# Patient Record
Sex: Female | Born: 1945 | Race: White | Hispanic: No | Marital: Married | Smoking: Former smoker
Health system: Southern US, Community
[De-identification: ages and names within clinical notes are randomized; demographics above are authoritative.]

## PROBLEM LIST (undated history)

## (undated) DIAGNOSIS — N189 Chronic kidney disease, unspecified: Secondary | ICD-10-CM

## (undated) DIAGNOSIS — M1990 Unspecified osteoarthritis, unspecified site: Secondary | ICD-10-CM

## (undated) DIAGNOSIS — K5900 Constipation, unspecified: Secondary | ICD-10-CM

## (undated) DIAGNOSIS — R233 Spontaneous ecchymoses: Secondary | ICD-10-CM

## (undated) DIAGNOSIS — I10 Essential (primary) hypertension: Secondary | ICD-10-CM

## (undated) DIAGNOSIS — M2550 Pain in unspecified joint: Secondary | ICD-10-CM

## (undated) DIAGNOSIS — G8929 Other chronic pain: Secondary | ICD-10-CM

## (undated) DIAGNOSIS — K219 Gastro-esophageal reflux disease without esophagitis: Secondary | ICD-10-CM

## (undated) DIAGNOSIS — M810 Age-related osteoporosis without current pathological fracture: Secondary | ICD-10-CM

## (undated) DIAGNOSIS — H269 Unspecified cataract: Secondary | ICD-10-CM

## (undated) DIAGNOSIS — M109 Gout, unspecified: Secondary | ICD-10-CM

## (undated) DIAGNOSIS — M62838 Other muscle spasm: Secondary | ICD-10-CM

## (undated) DIAGNOSIS — R351 Nocturia: Secondary | ICD-10-CM

## (undated) DIAGNOSIS — R350 Frequency of micturition: Secondary | ICD-10-CM

## (undated) DIAGNOSIS — R1310 Dysphagia, unspecified: Secondary | ICD-10-CM

## (undated) DIAGNOSIS — R42 Dizziness and giddiness: Secondary | ICD-10-CM

## (undated) DIAGNOSIS — R531 Weakness: Secondary | ICD-10-CM

## (undated) DIAGNOSIS — R238 Other skin changes: Secondary | ICD-10-CM

## (undated) DIAGNOSIS — M549 Dorsalgia, unspecified: Secondary | ICD-10-CM

## (undated) HISTORY — PX: EYE SURGERY: SHX253

## (undated) HISTORY — PX: TONSILLECTOMY: SUR1361

## (undated) HISTORY — PX: COLONOSCOPY: SHX174

## (undated) HISTORY — PX: DILATION AND CURETTAGE OF UTERUS: SHX78

## (undated) HISTORY — PX: CARPAL TUNNEL RELEASE: SHX101

## (undated) HISTORY — PX: COLONOSCOPY WITH ESOPHAGOGASTRODUODENOSCOPY (EGD) AND ESOPHAGEAL DILATION (ED): SHX6495

---

## 2005-11-14 HISTORY — PX: BACK SURGERY: SHX140

## 2005-12-31 ENCOUNTER — Encounter
Admission: RE | Admit: 2005-12-31 | Discharge: 2005-12-31 | Payer: Self-pay | Admitting: Physical Medicine and Rehabilitation

## 2006-08-08 ENCOUNTER — Encounter
Admission: RE | Admit: 2006-08-08 | Discharge: 2006-08-08 | Payer: Self-pay | Admitting: Physical Medicine and Rehabilitation

## 2006-08-18 ENCOUNTER — Ambulatory Visit (HOSPITAL_COMMUNITY): Admission: RE | Admit: 2006-08-18 | Discharge: 2006-08-19 | Payer: Self-pay | Admitting: Neurological Surgery

## 2008-03-30 ENCOUNTER — Encounter: Admission: RE | Admit: 2008-03-30 | Discharge: 2008-03-30 | Payer: Self-pay | Admitting: Neurological Surgery

## 2011-04-01 NOTE — Op Note (Signed)
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   PROCEDURES:  Decompressive laminectomy, bilateral medial facetectomy and  foraminotomy, L2-3, followed by bilateral microdiskectomy, L2-3, for nerve  root and control canal decompression using microscopic dissection.   SURGEON:  Tia Alert, MD.   ASSISTANT:  Dr. Altamease Oiler.   ANESTHESIA:  General endotracheal.   COMPLICATIONS:  None apparent.   INDICATIONS FOR PROCEDURE:  Tina Shaw is a 64 year old female who has a  long history of back pain.  However, she has undergone epidural steroid  injections, and after her last injection, noted some onset of acute pain  with difficulty with ambulation.  She had a followup CT myelogram, which  showed a complete myelographic block at L2-3, and she was sent for  neurosurgical evaluation.  I found her to have vague weakness in her legs  and complaining of severe leg pain and some numbness.  Her CT myelogram  showed a complete myelographic block at L2-3 from what looked like a large  disk herniation.  I recommended a decompressive laminectomy, followed by  microdiskectomy.  She understood the risks, benefits and expected outcomes  and wished to proceed.   DESCRIPTION OF PROCEDURE:  The patient was taken to the operating room, and  after induction of adequate general endotracheal anesthesia, she was rolled  into the prone  position on the Wilson frame and all pressure points were  padded.  Her lumbar region was prepped with DuraPrep and draped in the usual  sterile fashion.  Then, 5 cc of local anesthesia was injected, and a small  dorsal midline incision was made and carried through to the lumbosacral  fascia.  The fascia was opened and the paraspinous musculature was taken  down in a subperiosteal fashion to expose the L2-3 interspace bilaterally.  A self-retaining retractor was placed.  Intraoperative x-ray confirmed our  level, and then I removed the spinous process, and then used the Kerrison  punch and a high-speed drill to perform a laminectomy, medial facetectomy  and bilateral foraminotomy at L2-3.  As I removed the yellow ligament, I  identified the underlying dura and the L3 nerve roots bilaterally.  She had  a small pinhole in the dura, which is where her myelogram was performed.  This was noted.  We retracted the dura medially and found several large free  fragments, which were removed from the lateral recess on the left side, and  then brought in the operating microscope and incised the disk space  and  performed a thorough intradiskal diskectomy with pituitary rongeurs.  We  then exposed the other side and coagulated the epidural venous fascia, where  we found a large free fragment on that side as well.  Once our diskectomy  was complete, we palpated with a coronary dilator into the midline and into  the foramina.  We felt no more compression and the thecal sac was  decompressed.  We irrigated with saline solution.  We then lined the dura  with Gelfoam, and then used Tisseel fibrin glue because of the myelogram  hole at the distal end of the interspace.  We then dried all bleeding points  and closed the fascia with a #1 Vicryl for the subcutaneous and subcuticular  tissues, and 2-0 and 3-0 Vicryl, and closed the skin with Dermabond.  The  drapes were removed.  A sterile dressing was applied.  The  patient was  awakened from general anesthesia and transferred to the recovery room in  stable condition.  At the end of the procedure, all sponge, needle and  instrument counts were correct.      Tia Alert, MD  Electronically Signed     DSJ/MEDQ  D:  08/18/2006  T:  08/19/2006  Job:  [id number]

## 2011-09-26 ENCOUNTER — Other Ambulatory Visit: Payer: Self-pay | Admitting: Neurological Surgery

## 2011-09-26 DIAGNOSIS — M545 Low back pain, unspecified: Secondary | ICD-10-CM

## 2011-10-02 ENCOUNTER — Ambulatory Visit
Admission: RE | Admit: 2011-10-02 | Discharge: 2011-10-02 | Disposition: A | Discharge status: Home / Self Care | Payer: BC Managed Care – PPO | Source: Ambulatory Visit | Attending: Neurological Surgery | Admitting: Neurological Surgery

## 2011-10-02 DIAGNOSIS — M545 Low back pain: Secondary | ICD-10-CM

## 2015-04-15 HISTORY — PX: CHOLECYSTECTOMY: SHX55

## 2015-06-24 ENCOUNTER — Other Ambulatory Visit: Payer: Self-pay | Admitting: Neurological Surgery

## 2015-07-08 ENCOUNTER — Encounter (HOSPITAL_COMMUNITY)
Admission: RE | Admit: 2015-07-08 | Discharge: 2015-07-08 | Disposition: A | Discharge status: Home / Self Care | Payer: BLUE CROSS/BLUE SHIELD | Source: Ambulatory Visit | Attending: Neurological Surgery | Admitting: Neurological Surgery

## 2015-07-08 ENCOUNTER — Encounter (HOSPITAL_COMMUNITY): Payer: Self-pay

## 2015-07-08 DIAGNOSIS — Z01818 Encounter for other preprocedural examination: Secondary | ICD-10-CM | POA: Diagnosis present

## 2015-07-08 DIAGNOSIS — I10 Essential (primary) hypertension: Secondary | ICD-10-CM | POA: Diagnosis not present

## 2015-07-08 DIAGNOSIS — Z01812 Encounter for preprocedural laboratory examination: Secondary | ICD-10-CM | POA: Insufficient documentation

## 2015-07-08 DIAGNOSIS — K219 Gastro-esophageal reflux disease without esophagitis: Secondary | ICD-10-CM | POA: Diagnosis not present

## 2015-07-08 DIAGNOSIS — M4806 Spinal stenosis, lumbar region: Secondary | ICD-10-CM | POA: Diagnosis not present

## 2015-07-08 DIAGNOSIS — Z87891 Personal history of nicotine dependence: Secondary | ICD-10-CM | POA: Diagnosis not present

## 2015-07-08 DIAGNOSIS — M48061 Spinal stenosis, lumbar region without neurogenic claudication: Secondary | ICD-10-CM

## 2015-07-08 HISTORY — DX: Dizziness and giddiness: R42

## 2015-07-08 HISTORY — DX: Age-related osteoporosis without current pathological fracture: M81.0

## 2015-07-08 HISTORY — DX: Other muscle spasm: M62.838

## 2015-07-08 HISTORY — DX: Chronic kidney disease, unspecified: N18.9

## 2015-07-08 HISTORY — DX: Essential (primary) hypertension: I10

## 2015-07-08 HISTORY — DX: Other chronic pain: G89.29

## 2015-07-08 HISTORY — DX: Unspecified cataract: H26.9

## 2015-07-08 HISTORY — DX: Nocturia: R35.1

## 2015-07-08 HISTORY — DX: Frequency of micturition: R35.0

## 2015-07-08 HISTORY — DX: Constipation, unspecified: K59.00

## 2015-07-08 HISTORY — DX: Gout, unspecified: M10.9

## 2015-07-08 HISTORY — DX: Unspecified osteoarthritis, unspecified site: M19.90

## 2015-07-08 HISTORY — DX: Gastro-esophageal reflux disease without esophagitis: K21.9

## 2015-07-08 HISTORY — DX: Pain in unspecified joint: M25.50

## 2015-07-08 HISTORY — DX: Weakness: R53.1

## 2015-07-08 HISTORY — DX: Dysphagia, unspecified: R13.10

## 2015-07-08 HISTORY — DX: Spontaneous ecchymoses: R23.3

## 2015-07-08 HISTORY — DX: Other skin changes: R23.8

## 2015-07-08 HISTORY — DX: Dorsalgia, unspecified: M54.9

## 2015-07-08 LAB — CBC WITH DIFFERENTIAL/PLATELET
BASOS ABS: 0.1 10*3/uL (ref 0.0–0.1)
BASOS PCT: 0 % (ref 0–1)
EOS ABS: 0.1 10*3/uL (ref 0.0–0.7)
Eosinophils Relative: 1 % (ref 0–5)
HCT: 41.6 % (ref 36.0–46.0)
Hemoglobin: 14 g/dL (ref 12.0–15.0)
Lymphocytes Relative: 22 % (ref 12–46)
Lymphs Abs: 2.7 10*3/uL (ref 0.7–4.0)
MCH: 30.2 pg (ref 26.0–34.0)
MCHC: 33.7 g/dL (ref 30.0–36.0)
MCV: 89.8 fL (ref 78.0–100.0)
MONO ABS: 0.8 10*3/uL (ref 0.1–1.0)
MONOS PCT: 6 % (ref 3–12)
Neutro Abs: 8.8 10*3/uL — ABNORMAL HIGH (ref 1.7–7.7)
Neutrophils Relative %: 71 % (ref 43–77)
PLATELETS: 390 10*3/uL (ref 150–400)
RBC: 4.63 MIL/uL (ref 3.87–5.11)
RDW: 13.9 % (ref 11.5–15.5)
WBC: 12.4 10*3/uL — ABNORMAL HIGH (ref 4.0–10.5)

## 2015-07-08 LAB — BASIC METABOLIC PANEL
ANION GAP: 9 (ref 5–15)
BUN: 9 mg/dL (ref 6–20)
CALCIUM: 9.8 mg/dL (ref 8.9–10.3)
CO2: 27 mmol/L (ref 22–32)
CREATININE: 1.27 mg/dL — AB (ref 0.44–1.00)
Chloride: 104 mmol/L (ref 101–111)
GFR, EST AFRICAN AMERICAN: 50 mL/min — AB (ref >60)
GFR, EST NON AFRICAN AMERICAN: 43 mL/min — AB (ref >60)
GLUCOSE: 97 mg/dL (ref 65–99)
Potassium: 3.5 mmol/L (ref 3.5–5.1)
Sodium: 140 mmol/L (ref 135–145)

## 2015-07-08 LAB — SURGICAL PCR SCREEN
MRSA, PCR: NEGATIVE
STAPHYLOCOCCUS AUREUS: NEGATIVE

## 2015-07-08 LAB — PROTIME-INR
INR: 1.05 (ref 0.00–1.49)
PROTHROMBIN TIME: 13.9 s (ref 11.6–15.2)

## 2015-07-08 NOTE — Pre-Procedure Instructions (Signed)
Tina Shaw  07/08/2015      [facility] [address] [place] - [address] [place] [address] Phone: [phone] Fax: [phone]    Your procedure is scheduled on Fri, Sept 2 @ 11:00 AM  Report to [facility] Admitting at 9:00 AM  Call this number if you have problems the morning of surgery:  [phone]   Remember:  Do not eat food or drink liquids after midnight.  Take these medicines the morning of surgery with A SIP OF WATER Allopurinol(Zyloprim),Diltiazem(Cardizem),Pain Pill(if needed),Antivert(Meclizine),and Zantac(Ranitidine)              No Goody's,BC's,Aleve,Aspirin,Ibuprofen,Fish Oil,or any Herbal Medications.    Do not wear jewelry, make-up or nail polish.  Do not wear lotions, powders, or perfumes.  You may wear deodorant.  Do not shave 48 hours prior to surgery.    Do not bring valuables to the hospital.  [facility] is not responsible for any belongings or valuables.  Contacts, dentures or bridgework may not be worn into surgery.  Leave your suitcase in the car.  After surgery it may be brought to your room.  For patients admitted to the hospital, discharge time will be determined by your treatment team.  Patients discharged the day of surgery will not be allowed to drive home.    Special instructions:  [facility] - Preparing for Surgery  Before surgery, you can play an important role.  Because skin is not sterile, your skin needs to be as free of germs as possible.  You can reduce the number of germs on you skin by washing with CHG (chlorahexidine gluconate) soap before surgery.  CHG is an antiseptic cleaner which kills germs and bonds with the skin to continue killing germs even after washing.  Please DO NOT use if you have an allergy to CHG or antibacterial soaps.  If your skin becomes reddened/irritated stop using the CHG and inform your nurse when you arrive at Short Stay.  Do not shave (including  legs and underarms) for at least 48 hours prior to the first CHG shower.  You may shave your face.  Please follow these instructions carefully:   1.  Shower with CHG Soap the night before surgery and the                                morning of Surgery.  2.  If you choose to wash your hair, wash your hair first as usual with your       normal shampoo.  3.  After you shampoo, rinse your hair and body thoroughly to remove the                      Shampoo.  4.  Use CHG as you would any other liquid soap.  You can apply chg directly       to the skin and wash gently with scrungie or a clean washcloth.  5.  Apply the CHG Soap to your body ONLY FROM THE NECK DOWN.        Do not use on open wounds or open sores.  Avoid contact with your eyes,       ears, mouth and genitals (private parts).  Wash genitals (private parts)       with your normal soap.  6.  Wash thoroughly, paying special attention to the area  where your surgery        will be performed.  7.  Thoroughly rinse your body with warm water from the neck down.  8.  DO NOT shower/wash with your normal soap after using and rinsing off       the CHG Soap.  9.  Pat yourself dry with a clean towel.            10.  Wear clean pajamas.            11.  Place clean sheets on your bed the night of your first shower and do not        sleep with pets.  Day of Surgery  Do not apply any lotions/deoderants the morning of surgery.  Please wear clean clothes to the hospital/surgery center.    Please read over the following fact sheets that you were given. Pain Booklet, Coughing and Deep Breathing, MRSA Information and Surgical Site Infection Prevention

## 2015-07-08 NOTE — Progress Notes (Addendum)
Cardiologist denies having one  Medical Md is Dr.Betty Elige Radon in [place]  EKG to be requested from [facility] in [place]  CXR denies having one in past yr  Echo denies ever having one  Stress test >14yrs ago  Heart cath denies ever having one

## 2015-07-16 MED ORDER — CEFAZOLIN SODIUM-DEXTROSE 2-3 GM-% IV SOLR
2.0000 g | INTRAVENOUS | Status: AC
  Administered 2015-07-17: 2 g via INTRAVENOUS
  Filled 2015-07-16: qty 50

## 2015-07-16 MED ORDER — DEXAMETHASONE SODIUM PHOSPHATE 10 MG/ML IJ SOLN
10.0000 mg | INTRAMUSCULAR | Status: AC
  Administered 2015-07-17: 10 mg via INTRAVENOUS
  Filled 2015-07-16: qty 1

## 2015-07-16 NOTE — Anesthesia Preprocedure Evaluation (Addendum)
Anesthesia Evaluation  Patient identified by MRN, date of birth, ID band Patient awake    Reviewed: Allergy & Precautions, NPO status , Patient's Chart, lab work & pertinent test results  Airway Mallampati: II   Neck ROM: Full    Dental  (+) Dental Advisory Given, Teeth Intact   Pulmonary former smoker,  breath sounds clear to auscultation        Cardiovascular hypertension, Pt. on medications Rhythm:Regular     Neuro/Psych    GI/Hepatic GERD-  ,  Endo/Other    Renal/GU Renal InsufficiencyRenal diseaseCreat 1.27     Musculoskeletal   Abdominal (+) + obese,   Peds  Hematology 14/42   Anesthesia Other Findings   Reproductive/Obstetrics                            Anesthesia Physical Anesthesia Plan  ASA: III  Anesthesia Plan: General   Post-op Pain Management:    Induction: Intravenous  Airway Management Planned: Oral ETT and Video Laryngoscope Planned  Additional Equipment:   Intra-op Plan:   Post-operative Plan: Extubation in OR  Informed Consent: I have reviewed the patients History and Physical, chart, labs and discussed the procedure including the risks, benefits and alternatives for the proposed anesthesia with the patient or authorized representative who has indicated his/her understanding and acceptance.     Plan Discussed with:   Anesthesia Plan Comments: (Glide available, GET EKG)        Anesthesia Quick Evaluation

## 2015-07-17 ENCOUNTER — Encounter (HOSPITAL_COMMUNITY): Payer: Self-pay | Admitting: *Deleted

## 2015-07-17 ENCOUNTER — Inpatient Hospital Stay (HOSPITAL_COMMUNITY)
Admission: RE | Admit: 2015-07-17 | Discharge: 2015-07-17 | DRG: 520 | Disposition: A | Discharge status: Home / Self Care | Payer: BLUE CROSS/BLUE SHIELD | Source: Ambulatory Visit | Attending: Neurological Surgery | Admitting: Neurological Surgery

## 2015-07-17 ENCOUNTER — Inpatient Hospital Stay (HOSPITAL_COMMUNITY): Payer: BLUE CROSS/BLUE SHIELD | Admitting: Anesthesiology

## 2015-07-17 ENCOUNTER — Encounter (HOSPITAL_COMMUNITY)
Admission: AD | Disposition: A | Discharge status: Home / Self Care | Payer: Self-pay | Source: Ambulatory Visit | Attending: Neurological Surgery

## 2015-07-17 ENCOUNTER — Inpatient Hospital Stay (HOSPITAL_COMMUNITY): Payer: BLUE CROSS/BLUE SHIELD

## 2015-07-17 DIAGNOSIS — M419 Scoliosis, unspecified: Secondary | ICD-10-CM | POA: Diagnosis present

## 2015-07-17 DIAGNOSIS — M62838 Other muscle spasm: Secondary | ICD-10-CM | POA: Diagnosis present

## 2015-07-17 DIAGNOSIS — Z9049 Acquired absence of other specified parts of digestive tract: Secondary | ICD-10-CM | POA: Diagnosis present

## 2015-07-17 DIAGNOSIS — Z87891 Personal history of nicotine dependence: Secondary | ICD-10-CM

## 2015-07-17 DIAGNOSIS — M4806 Spinal stenosis, lumbar region: Secondary | ICD-10-CM | POA: Diagnosis present

## 2015-07-17 DIAGNOSIS — K5900 Constipation, unspecified: Secondary | ICD-10-CM | POA: Diagnosis present

## 2015-07-17 DIAGNOSIS — R351 Nocturia: Secondary | ICD-10-CM | POA: Diagnosis present

## 2015-07-17 DIAGNOSIS — R350 Frequency of micturition: Secondary | ICD-10-CM | POA: Diagnosis present

## 2015-07-17 DIAGNOSIS — M1990 Unspecified osteoarthritis, unspecified site: Secondary | ICD-10-CM | POA: Diagnosis present

## 2015-07-17 DIAGNOSIS — H269 Unspecified cataract: Secondary | ICD-10-CM | POA: Diagnosis present

## 2015-07-17 DIAGNOSIS — G8929 Other chronic pain: Secondary | ICD-10-CM | POA: Diagnosis present

## 2015-07-17 DIAGNOSIS — R1310 Dysphagia, unspecified: Secondary | ICD-10-CM | POA: Diagnosis present

## 2015-07-17 DIAGNOSIS — I129 Hypertensive chronic kidney disease with stage 1 through stage 4 chronic kidney disease, or unspecified chronic kidney disease: Secondary | ICD-10-CM | POA: Diagnosis present

## 2015-07-17 DIAGNOSIS — R42 Dizziness and giddiness: Secondary | ICD-10-CM | POA: Diagnosis present

## 2015-07-17 DIAGNOSIS — M4326 Fusion of spine, lumbar region: Secondary | ICD-10-CM

## 2015-07-17 DIAGNOSIS — N183 Chronic kidney disease, stage 3 (moderate): Secondary | ICD-10-CM | POA: Diagnosis present

## 2015-07-17 DIAGNOSIS — K219 Gastro-esophageal reflux disease without esophagitis: Secondary | ICD-10-CM | POA: Diagnosis present

## 2015-07-17 DIAGNOSIS — Z79891 Long term (current) use of opiate analgesic: Secondary | ICD-10-CM

## 2015-07-17 DIAGNOSIS — M5126 Other intervertebral disc displacement, lumbar region: Secondary | ICD-10-CM | POA: Diagnosis present

## 2015-07-17 DIAGNOSIS — M109 Gout, unspecified: Secondary | ICD-10-CM | POA: Diagnosis present

## 2015-07-17 DIAGNOSIS — Z9889 Other specified postprocedural states: Secondary | ICD-10-CM

## 2015-07-17 DIAGNOSIS — M810 Age-related osteoporosis without current pathological fracture: Secondary | ICD-10-CM | POA: Diagnosis present

## 2015-07-17 DIAGNOSIS — Z79899 Other long term (current) drug therapy: Secondary | ICD-10-CM | POA: Diagnosis not present

## 2015-07-17 HISTORY — PX: LUMBAR LAMINECTOMY/DECOMPRESSION MICRODISCECTOMY: SHX5026

## 2015-07-17 SURGERY — LUMBAR LAMINECTOMY/DECOMPRESSION MICRODISCECTOMY 2 LEVELS
Anesthesia: General | Site: Back | Laterality: Bilateral

## 2015-07-17 MED ORDER — GELATIN ABSORBABLE MT POWD
OROMUCOSAL | Status: DC | PRN
  Administered 2015-07-17: 10 mL via TOPICAL

## 2015-07-17 MED ORDER — MENTHOL 3 MG MT LOZG: 1.0000 | LOZENGE | OROMUCOSAL | Status: DC | PRN

## 2015-07-17 MED ORDER — PROPOFOL 10 MG/ML IV BOLUS
INTRAVENOUS | Status: DC | PRN
  Administered 2015-07-17: 30 mg via INTRAVENOUS
  Administered 2015-07-17: 170 mg via INTRAVENOUS

## 2015-07-17 MED ORDER — CEFAZOLIN SODIUM 1-5 GM-% IV SOLN
1.0000 g | Freq: Three times a day (TID) | INTRAVENOUS | Status: DC
  Filled 2015-07-17 (×2): qty 50

## 2015-07-17 MED ORDER — PROMETHAZINE HCL 25 MG/ML IJ SOLN: 6.2500 mg | INTRAMUSCULAR | Status: DC | PRN

## 2015-07-17 MED ORDER — CYCLOBENZAPRINE HCL 10 MG PO TABS: 10.0000 mg | ORAL_TABLET | Freq: Three times a day (TID) | ORAL | Status: DC | PRN

## 2015-07-17 MED ORDER — POTASSIUM CHLORIDE IN NACL 20-0.9 MEQ/L-% IV SOLN
INTRAVENOUS | Status: DC
  Filled 2015-07-17 (×2): qty 1000

## 2015-07-17 MED ORDER — ONDANSETRON HCL 4 MG/2ML IJ SOLN
INTRAMUSCULAR | Status: DC | PRN
Start: 2015-07-17 — End: 2015-07-17
  Administered 2015-07-17: 4 mg via INTRAVENOUS

## 2015-07-17 MED ORDER — MIDAZOLAM HCL 5 MG/5ML IJ SOLN
INTRAMUSCULAR | Status: DC | PRN
  Administered 2015-07-17: 2 mg via INTRAVENOUS

## 2015-07-17 MED ORDER — SUCCINYLCHOLINE CHLORIDE 20 MG/ML IJ SOLN
INTRAMUSCULAR | Status: AC
  Filled 2015-07-17: qty 1

## 2015-07-17 MED ORDER — DILTIAZEM HCL ER COATED BEADS 240 MG PO CP24: 240.0000 mg | ORAL_CAPSULE | Freq: Every day | ORAL | Status: DC

## 2015-07-17 MED ORDER — PHENYLEPHRINE HCL 10 MG/ML IJ SOLN
10.0000 mg | INTRAVENOUS | Status: DC | PRN
  Administered 2015-07-17: 40 ug/min via INTRAVENOUS

## 2015-07-17 MED ORDER — SODIUM CHLORIDE 0.9 % IR SOLN
Status: DC | PRN
  Administered 2015-07-17: 500 mL

## 2015-07-17 MED ORDER — HYDROCODONE-ACETAMINOPHEN 5-325 MG PO TABS
1.0000 | ORAL_TABLET | ORAL | Status: DC | PRN
  Administered 2015-07-17: 2 via ORAL
  Filled 2015-07-17: qty 2

## 2015-07-17 MED ORDER — LISINOPRIL 20 MG PO TABS: 20.0000 mg | ORAL_TABLET | Freq: Every day | ORAL | Status: DC

## 2015-07-17 MED ORDER — SODIUM CHLORIDE 0.9 % IV SOLN
INTRAVENOUS | Status: DC
  Administered 2015-07-17 (×2): via INTRAVENOUS

## 2015-07-17 MED ORDER — 0.9 % SODIUM CHLORIDE (POUR BTL) OPTIME
TOPICAL | Status: DC | PRN
  Administered 2015-07-17: 1000 mL

## 2015-07-17 MED ORDER — PROPOFOL 10 MG/ML IV BOLUS
INTRAVENOUS | Status: AC
  Filled 2015-07-17: qty 20

## 2015-07-17 MED ORDER — ACETAMINOPHEN 325 MG PO TABS: 650.0000 mg | ORAL_TABLET | ORAL | Status: DC | PRN

## 2015-07-17 MED ORDER — NEOSTIGMINE METHYLSULFATE 10 MG/10ML IV SOLN
INTRAVENOUS | Status: AC
  Filled 2015-07-17: qty 1

## 2015-07-17 MED ORDER — MORPHINE SULFATE (PF) 2 MG/ML IV SOLN: 1.0000 mg | INTRAVENOUS | Status: DC | PRN

## 2015-07-17 MED ORDER — PHENOL 1.4 % MT LIQD
1.0000 | OROMUCOSAL | Status: DC | PRN
Start: 2015-07-17 — End: 2015-07-17

## 2015-07-17 MED ORDER — ONDANSETRON HCL 4 MG/2ML IJ SOLN
INTRAMUSCULAR | Status: AC
  Filled 2015-07-17: qty 2

## 2015-07-17 MED ORDER — BUPIVACAINE HCL (PF) 0.25 % IJ SOLN
INTRAMUSCULAR | Status: DC | PRN
  Administered 2015-07-17: 4 mL

## 2015-07-17 MED ORDER — FENTANYL CITRATE (PF) 100 MCG/2ML IJ SOLN
INTRAMUSCULAR | Status: AC
  Filled 2015-07-17: qty 2

## 2015-07-17 MED ORDER — SODIUM CHLORIDE 0.9 % IV SOLN: 250.0000 mL | INTRAVENOUS | Status: DC

## 2015-07-17 MED ORDER — MIDAZOLAM HCL 2 MG/2ML IJ SOLN
INTRAMUSCULAR | Status: AC
  Filled 2015-07-17: qty 4

## 2015-07-17 MED ORDER — ACETAMINOPHEN 10 MG/ML IV SOLN
INTRAVENOUS | Status: AC
  Administered 2015-07-17: 1000 mg via INTRAVENOUS
  Filled 2015-07-17: qty 100

## 2015-07-17 MED ORDER — ROCURONIUM BROMIDE 50 MG/5ML IV SOLN
INTRAVENOUS | Status: AC
  Filled 2015-07-17: qty 1

## 2015-07-17 MED ORDER — MECLIZINE HCL 12.5 MG PO TABS: 12.5000 mg | ORAL_TABLET | Freq: Three times a day (TID) | ORAL | Status: DC | PRN

## 2015-07-17 MED ORDER — SODIUM CHLORIDE 0.9 % IJ SOLN: 3.0000 mL | Freq: Two times a day (BID) | INTRAMUSCULAR | Status: DC

## 2015-07-17 MED ORDER — ROCURONIUM BROMIDE 100 MG/10ML IV SOLN
INTRAVENOUS | Status: DC | PRN
  Administered 2015-07-17: 35 mg via INTRAVENOUS

## 2015-07-17 MED ORDER — LIDOCAINE HCL (CARDIAC) 20 MG/ML IV SOLN
INTRAVENOUS | Status: AC
  Filled 2015-07-17: qty 5

## 2015-07-17 MED ORDER — ALLOPURINOL 100 MG PO TABS: 100.0000 mg | ORAL_TABLET | Freq: Every day | ORAL | Status: DC

## 2015-07-17 MED ORDER — MEPERIDINE HCL 25 MG/ML IJ SOLN: 6.2500 mg | INTRAMUSCULAR | Status: DC | PRN

## 2015-07-17 MED ORDER — NEOSTIGMINE METHYLSULFATE 10 MG/10ML IV SOLN
INTRAVENOUS | Status: DC | PRN
  Administered 2015-07-17: 3 mg via INTRAVENOUS

## 2015-07-17 MED ORDER — INFLUENZA VAC SPLIT QUAD 0.5 ML IM SUSY: 0.5000 mL | PREFILLED_SYRINGE | INTRAMUSCULAR | Status: DC

## 2015-07-17 MED ORDER — THROMBIN 5000 UNITS EX SOLR
CUTANEOUS | Status: DC | PRN
  Administered 2015-07-17 (×2): 5000 [IU] via TOPICAL

## 2015-07-17 MED ORDER — LIDOCAINE HCL (CARDIAC) 20 MG/ML IV SOLN
INTRAVENOUS | Status: DC | PRN
  Administered 2015-07-17: 100 mg via INTRAVENOUS

## 2015-07-17 MED ORDER — GLYCOPYRROLATE 0.2 MG/ML IJ SOLN
INTRAMUSCULAR | Status: AC
  Filled 2015-07-17: qty 2

## 2015-07-17 MED ORDER — SODIUM CHLORIDE 0.9 % IJ SOLN: 3.0000 mL | INTRAMUSCULAR | Status: DC | PRN

## 2015-07-17 MED ORDER — SUFENTANIL CITRATE 50 MCG/ML IV SOLN
INTRAVENOUS | Status: AC
  Filled 2015-07-17: qty 1

## 2015-07-17 MED ORDER — PHENYLEPHRINE HCL 10 MG/ML IJ SOLN
INTRAMUSCULAR | Status: DC | PRN
  Administered 2015-07-17: 80 ug via INTRAVENOUS
  Administered 2015-07-17 (×2): 40 ug via INTRAVENOUS

## 2015-07-17 MED ORDER — DOCUSATE SODIUM 100 MG PO CAPS: 100.0000 mg | ORAL_CAPSULE | Freq: Two times a day (BID) | ORAL | Status: DC

## 2015-07-17 MED ORDER — GLYCOPYRROLATE 0.2 MG/ML IJ SOLN
INTRAMUSCULAR | Status: DC | PRN
  Administered 2015-07-17: 0.4 mg via INTRAVENOUS

## 2015-07-17 MED ORDER — FENTANYL CITRATE (PF) 100 MCG/2ML IJ SOLN
25.0000 ug | INTRAMUSCULAR | Status: DC | PRN
  Administered 2015-07-17 (×2): 50 ug via INTRAVENOUS

## 2015-07-17 MED ORDER — ACETAMINOPHEN 650 MG RE SUPP: 650.0000 mg | RECTAL | Status: DC | PRN

## 2015-07-17 MED ORDER — ONDANSETRON HCL 4 MG/2ML IJ SOLN: 4.0000 mg | INTRAMUSCULAR | Status: DC | PRN

## 2015-07-17 MED ORDER — SUFENTANIL CITRATE 50 MCG/ML IV SOLN
INTRAVENOUS | Status: DC | PRN
  Administered 2015-07-17: 20 ug via INTRAVENOUS
  Administered 2015-07-17: 10 ug via INTRAVENOUS

## 2015-07-17 MED ORDER — PNEUMOCOCCAL VAC POLYVALENT 25 MCG/0.5ML IJ INJ: 0.5000 mL | INJECTION | INTRAMUSCULAR | Status: DC

## 2015-07-17 SURGICAL SUPPLY — 39 items
BAG DECANTER FOR FLEXI CONT (MISCELLANEOUS) ×3 IMPLANT
BENZOIN TINCTURE PRP APPL 2/3 (GAUZE/BANDAGES/DRESSINGS) ×3 IMPLANT
BUR MATCHSTICK NEURO 3.0 LAGG (BURR) ×3 IMPLANT
CANISTER SUCT 3000ML PPV (MISCELLANEOUS) ×3 IMPLANT
CLOSURE WOUND 1/2 X4 (GAUZE/BANDAGES/DRESSINGS) ×1
DRAPE LAPAROTOMY 100X72X124 (DRAPES) ×3 IMPLANT
DRAPE MICROSCOPE LEICA (MISCELLANEOUS) ×3 IMPLANT
DRAPE POUCH INSTRU U-SHP 10X18 (DRAPES) ×3 IMPLANT
DRAPE SURG 17X23 STRL (DRAPES) ×3 IMPLANT
DRSG OPSITE POSTOP 4X6 (GAUZE/BANDAGES/DRESSINGS) ×3 IMPLANT
DURAPREP 26ML APPLICATOR (WOUND CARE) ×3 IMPLANT
ELECT REM PT RETURN 9FT ADLT (ELECTROSURGICAL) ×3
ELECTRODE REM PT RTRN 9FT ADLT (ELECTROSURGICAL) ×1 IMPLANT
GAUZE SPONGE 4X4 16PLY XRAY LF (GAUZE/BANDAGES/DRESSINGS) IMPLANT
GLOVE BIO SURGEON STRL SZ8 (GLOVE) ×3 IMPLANT
GOWN STRL REUS W/ TWL LRG LVL3 (GOWN DISPOSABLE) IMPLANT
GOWN STRL REUS W/ TWL XL LVL3 (GOWN DISPOSABLE) ×1 IMPLANT
GOWN STRL REUS W/TWL 2XL LVL3 (GOWN DISPOSABLE) IMPLANT
GOWN STRL REUS W/TWL LRG LVL3 (GOWN DISPOSABLE)
GOWN STRL REUS W/TWL XL LVL3 (GOWN DISPOSABLE) ×2
HEMOSTAT POWDER KIT SURGIFOAM (HEMOSTASIS) IMPLANT
KIT BASIN OR (CUSTOM PROCEDURE TRAY) ×3 IMPLANT
KIT ROOM TURNOVER OR (KITS) ×3 IMPLANT
NEEDLE HYPO 25X1 1.5 SAFETY (NEEDLE) ×3 IMPLANT
NEEDLE SPNL 20GX3.5 QUINCKE YW (NEEDLE) IMPLANT
NS IRRIG 1000ML POUR BTL (IV SOLUTION) ×3 IMPLANT
PACK LAMINECTOMY NEURO (CUSTOM PROCEDURE TRAY) ×3 IMPLANT
PAD ARMBOARD 7.5X6 YLW CONV (MISCELLANEOUS) ×9 IMPLANT
RUBBERBAND STERILE (MISCELLANEOUS) ×6 IMPLANT
SPONGE SURGIFOAM ABS GEL SZ50 (HEMOSTASIS) ×3 IMPLANT
STRIP CLOSURE SKIN 1/2X4 (GAUZE/BANDAGES/DRESSINGS) ×2 IMPLANT
SUT VIC AB 0 CT1 18XCR BRD8 (SUTURE) ×1 IMPLANT
SUT VIC AB 0 CT1 8-18 (SUTURE) ×2
SUT VIC AB 2-0 CP2 18 (SUTURE) ×3 IMPLANT
SUT VIC AB 3-0 SH 8-18 (SUTURE) ×3 IMPLANT
TAPE STRIPS DRAPE STRL (GAUZE/BANDAGES/DRESSINGS) ×3 IMPLANT
TOWEL OR 17X24 6PK STRL BLUE (TOWEL DISPOSABLE) ×3 IMPLANT
TOWEL OR 17X26 10 PK STRL BLUE (TOWEL DISPOSABLE) ×3 IMPLANT
WATER STERILE IRR 1000ML POUR (IV SOLUTION) ×3 IMPLANT

## 2015-07-17 NOTE — Progress Notes (Signed)
Pt doing well. Pt is awake, alert, and oriented. Pt has voided without difficulty. PT saw Pt and had no follow-up recommendations. Pt is ambulating without difficulty. Pt and sister given D/C instructions with Rx's, verbal understanding was provided. Pt's IV was removed prior to D/C. Pt's incision is covered with Honeycomb dressing and has no sign of infection. Pt D/C'd home via wheelchair @ 1845 per MD order. Pt is stable @ D/C and has no other needs at this time. Rema Fendt, RN

## 2015-07-17 NOTE — Anesthesia Postprocedure Evaluation (Signed)
  Anesthesia Post-op Note  Patient: Tina Shaw  Procedure(s) Performed: Procedure(s) with comments: LUMBAR LAMINECTOMY/DECOMPRESSION MICRODISCECTOMY 2 LEVELS (Bilateral) - Laminectomy and foraminotomy bilateral lumbar three-lumbar four, lumbar four-lumbar five, left lumbar five, sacral one  Patient Location: PACU  Anesthesia Type:General  Level of Consciousness: awake  Airway and Oxygen Therapy: Patient Spontanous Breathing  Post-op Pain: mild  Post-op Assessment: Post-op Vital signs reviewed              Post-op Vital Signs: Reviewed and stable  Last Vitals:  Filed Vitals:   07/17/15 1337  BP: 142/64  Pulse: 66  Temp:   Resp: 18    Complications: No apparent anesthesia complications

## 2015-07-17 NOTE — Transfer of Care (Signed)
Immediate Anesthesia Transfer of Care Note  Patient: Tina Shaw  Procedure(s) Performed: Procedure(s) with comments: LUMBAR LAMINECTOMY/DECOMPRESSION MICRODISCECTOMY 2 LEVELS (Bilateral) - Laminectomy and foraminotomy bilateral lumbar three-lumbar four, lumbar four-lumbar five, left lumbar five, sacral one  Patient Location: PACU  Anesthesia Type:General  Level of Consciousness: awake, oriented and patient cooperative  Airway & Oxygen Therapy: Patient Spontanous Breathing and Patient connected to nasal cannula oxygen  Post-op Assessment: Report given to RN, Post -op Vital signs reviewed and stable and Patient moving all extremities  Post vital signs: Reviewed and stable  Last Vitals:  Filed Vitals:   07/17/15 0903  BP: 180/77  Pulse: 55  Temp: 36.3 C  Resp: 20    Complications: No apparent anesthesia complications

## 2015-07-17 NOTE — Evaluation (Signed)
Physical Therapy Evaluation Patient Details Name: Tina Shaw MRN: [id number] DOB: 05/28/1946 Today's Date: 07/17/2015   History of Present Illness  pt is a 69 y/o female admitted with l34 and l45 stenosis, s/p Left L3-4 L4-5 and L5-S1 hemilaminectomy, medial facetectomy, foraminotomy, right L4-5 hemi-laminectomy medial facetectomy and foraminotomy, left L4-5 discectomy, sublaminar decompression at L3-4.  Clinical Impression  Pt is at or close to baseline functioning and should be safe at home.  All education has been completed.  There are no further acute PT needs.  Will sign off at this time.     Follow Up Recommendations No PT follow up    Equipment Recommendations  None recommended by PT    Recommendations for Other Services       Precautions / Restrictions Precautions Precautions: Back Restrictions Weight Bearing Restrictions: No      Mobility  Bed Mobility Overal bed mobility: Needs Assistance Bed Mobility: Sidelying to Sit;Rolling;Sit to Sidelying Rolling: Supervision Sidelying to sit: Supervision     Sit to sidelying: Supervision    Transfers Overall transfer level: Needs assistance   Transfers: Sit to/from Stand Sit to Stand: Supervision            Ambulation/Gait Ambulation/Gait assistance: Supervision Ambulation Distance (Feet): 140 Feet Assistive device: None Gait Pattern/deviations: Step-through pattern     General Gait Details: slightly antalgic, but steady and safe in a homelike environment  Stairs            Wheelchair Mobility    Modified Rankin (Stroke Patients Only)       Balance Overall balance assessment: No apparent balance deficits (not formally assessed)                                           Pertinent Vitals/Pain Pain Assessment: 0-10 Pain Score: 5  Pain Location: L leg/back Pain Descriptors / Indicators: Aching    Home Living Family/patient expects to be discharged to:: Private  residence Living Arrangements: Spouse/significant other Available Help at Discharge: Family Type of Home: House Home Access: Ramped entrance     Home Layout: One level Home Equipment: Environmental consultant - 2 wheels;Tub bench      Prior Function Level of Independence: Independent               Hand Dominance        Extremity/Trunk Assessment   Upper Extremity Assessment: Defer to OT evaluation           Lower Extremity Assessment: Overall WFL for tasks assessed         Communication   Communication: No difficulties  Cognition Arousal/Alertness: Awake/alert Behavior During Therapy: WFL for tasks assessed/performed Overall Cognitive Status: Within Functional Limits for tasks assessed                      General Comments General comments (skin integrity, edema, etc.): Advised back prec/care, log roll, lifting restrictions, progression of activity    Exercises        Assessment/Plan    PT Assessment Patent does not need any further PT services  PT Diagnosis Acute pain   PT Problem List    PT Treatment Interventions     PT Goals (Current goals can be found in the Care Plan section) Acute Rehab PT Goals PT Goal Formulation: All assessment and education complete, DC therapy    Frequency  Barriers to discharge        Co-evaluation               End of Session   Activity Tolerance: Patient tolerated treatment well Patient left: in bed;with call bell/phone within reach;with family/visitor present Nurse Communication: Mobility status         Time: 1545-1610 PT Time Calculation (min) (ACUTE ONLY): 25 min   Charges:   PT Evaluation $Initial PT Evaluation Tier I: 1 Procedure PT Treatments $Self Care/Home Management: 8-22   PT G Codes:        Belinda Bringhurst, Eliseo Gum 07/17/2015, 4:32 PM 07/17/2015  [place] Bing, PT [phone]  (pager)

## 2015-07-17 NOTE — Anesthesia Procedure Notes (Signed)
Procedure Name: Intubation Date/Time: 07/17/2015 10:44 AM Performed by: Charm Barges, Shmuel Girgis R Pre-anesthesia Checklist: Patient identified, Emergency Drugs available, Suction available, Patient being monitored and Timeout performed Patient Re-evaluated:Patient Re-evaluated prior to inductionOxygen Delivery Method: Circle system utilized Preoxygenation: Pre-oxygenation with 100% oxygen Intubation Type: IV induction Ventilation: Mask ventilation without difficulty Laryngoscope Size: Mac and 3 Grade View: Grade I Tube type: Oral Tube size: 7.5 mm Number of attempts: 1 Airway Equipment and Method: Stylet Placement Confirmation: ETT inserted through vocal cords under direct vision and positive ETCO2 Secured at: 21 cm Tube secured with: Tape Dental Injury: Teeth and Oropharynx as per pre-operative assessment

## 2015-07-17 NOTE — H&P (Signed)
Subjective: Patient is a 69 y.o. female admitted for stenosis. Onset of symptoms was several years ago, gradually worsening since that time.  The pain is rated severe, and is located at the across the lower back and radiates to legs. The pain is described as aching and occurs all day. The symptoms have been progressive. Symptoms are exacerbated by exercise. MRI or CT showed stenosis.   Past Medical History  Diagnosis Date  . Gout     takes Allopurinol daily  . GERD (gastroesophageal reflux disease)     takes Zantac daily  . Vertigo     takes Meclizine daily as needed  . Hypertension     takes Lisinopril and Diltiazem daily   . Constipation     takes Colace daily  . Muscle spasm     takes Flexeril daily as needed  . Dysphagia   . Weakness     numbness on the left leg  . Arthritis   . Joint pain   . Chronic back pain     scoliosis  . Osteoporosis   . Bruises easily   . Urinary frequency   . Chronic kidney disease     stage 3 kidney disease  . Nocturia   . Cataracts, bilateral     Past Surgical History  Procedure Laterality Date  . Tonsillectomy  at age 9  . Dilation and curettage of uterus    . Carpal tunnel release Bilateral   . Back surgery  2007  . Cholecystectomy  04/2015  . Colonoscopy    . Colonoscopy with esophagogastroduodenoscopy (egd) and esophageal dilation (ed)      Prior to Admission medications   Medication Sig Start Date End Date Taking? Authorizing Provider  allopurinol (ZYLOPRIM) 100 MG tablet Take 100 mg by mouth daily.   Yes Historical Provider, MD  Carbinoxamine Maleate 4 MG TABS Take 4 mg by mouth 3 (three) times daily as needed (allergies).   Yes Historical Provider, MD  cyclobenzaprine (FLEXERIL) 10 MG tablet Take 10 mg by mouth 2 (two) times daily as needed for muscle spasms.   Yes Historical Provider, MD  diltiazem (CARDIZEM CD) 240 MG 24 hr capsule Take 240 mg by mouth daily.   Yes Historical Provider, MD  docusate sodium (COLACE) 100 MG capsule  Take 100 mg by mouth daily.   Yes Historical Provider, MD  HYDROcodone-acetaminophen (NORCO) 7.5-325 MG per tablet Take 1 tablet by mouth every evening.   Yes Historical Provider, MD  lisinopril (PRINIVIL,ZESTRIL) 20 MG tablet Take 20 mg by mouth daily.   Yes Historical Provider, MD  meclizine (ANTIVERT) 12.5 MG tablet Take 12.5 mg by mouth 3 (three) times daily as needed for dizziness.   Yes Historical Provider, MD  ranitidine (ZANTAC) 150 MG tablet Take 150 mg by mouth 2 (two) times daily.   Yes Historical Provider, MD   No Known Allergies  Social History  Substance Use Topics  . Smoking status: Former Games developer  . Smokeless tobacco: Never Used     Comment: quit smoking 30+yrs ago  . Alcohol Use: No    History reviewed. No pertinent family history.   Review of Systems  Positive ROS: neg  All other systems have been reviewed and were otherwise negative with the exception of those mentioned in the HPI and as above.  Objective: Vital signs in last 24 hours: Temp:  [97.3 F (36.3 C)] 97.3 F (36.3 C) (09/02 0903) Pulse Rate:  [55] 55 (09/02 0903) Resp:  [20] 20 (09/02 0903)  BP: (180)/(77) 180/77 mmHg (09/02 0903) SpO2:  [97 %] 97 % (09/02 0903) Weight:  [223 lb (101.152 kg)] 223 lb (101.152 kg) (09/02 0903)  General Appearance: Alert, cooperative, no distress, appears stated age Head: Normocephalic, without obvious abnormality, atraumatic Eyes: PERRL, conjunctiva/corneas clear, EOM's intact    Neck: Supple, symmetrical, trachea midline Back: Symmetric, no curvature, ROM normal, no CVA tenderness Lungs:  respirations unlabored Heart: Regular rate and rhythm Abdomen: Soft, non-tender Extremities: Extremities normal, atraumatic, no cyanosis or edema Pulses: 2+ and symmetric all extremities Skin: Skin color, texture, turgor normal, no rashes or lesions  NEUROLOGIC:   Mental status: Alert and oriented x4,  no aphasia, good attention span, fund of knowledge, and memory Motor Exam  - grossly normal Sensory Exam - grossly normal Reflexes: 1+ Coordination - grossly normal Gait - grossly normal Balance - grossly normal Cranial Nerves: I: smell Not tested  II: visual acuity  OS: nl    OD: nl  II: visual fields Full to confrontation  II: pupils Equal, round, reactive to light  III,VII: ptosis None  III,IV,VI: extraocular muscles  Full ROM  V: mastication Normal  V: facial light touch sensation  Normal  V,VII: corneal reflex  Present  VII: facial muscle function - upper  Normal  VII: facial muscle function - lower Normal  VIII: hearing Not tested  IX: soft palate elevation  Normal  IX,X: gag reflex Present  XI: trapezius strength  5/5  XI: sternocleidomastoid strength 5/5  XI: neck flexion strength  5/5  XII: tongue strength  Normal    Data Review Lab Results  Component Value Date   WBC 12.4* 07/08/2015   HGB 14.0 07/08/2015   HCT 41.6 07/08/2015   MCV 89.8 07/08/2015   PLT 390 07/08/2015   Lab Results  Component Value Date   NA 140 07/08/2015   K 3.5 07/08/2015   CL 104 07/08/2015   CO2 27 07/08/2015   BUN 9 07/08/2015   CREATININE 1.27* 07/08/2015   GLUCOSE 97 07/08/2015   Lab Results  Component Value Date   INR 1.05 07/08/2015    Assessment/Plan: Patient admitted for lami for stenosis. Patient has failed a reasonable attempt at conservative therapy.  I explained the condition and procedure to the patient and answered any questions.  Patient wishes to proceed with procedure as planned. Understands risks/ benefits and typical outcomes of procedure.   Laymon Stockert S 07/17/2015 10:11 AM

## 2015-07-17 NOTE — Op Note (Signed)
07/17/2015  12:59 PM  PATIENT:  Tina Shaw  69 y.o. 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 Hali Marry  ANESTHESIA:   General  EBL: 200 ml  Total I/O In: 650 [I.V.:650] Out: -   BLOOD ADMINISTERED:none  DRAINS: None   SPECIMEN:  No Specimen  INDICATION FOR PROCEDURE: This patient presented with a long history of back and leg pain. She mostly had right leg pain until most recently she developed severe left leg pain. He tried medical management without relief. Recommended decompressive laminectomy for spinal stenosis after MRI showed spinal stenosis at L3-4 L4-5 and left lateral recess stenosis at L5-S1. Early recommended decompressive laminectomies at L3-4 and L4-5 but I found a large inferior fragment at L4-5 there required a left L5-S1 hemilaminectomy in order to adequately decompress the L5 nerve root from both superior and inferior to the pedicle. Patient understood the risks, benefits, and alternatives and potential outcomes and wished to proceed.  PROCEDURE DETAILS: The patient was taken to the operating room and after induction of adequate generalized endotracheal anesthesia, the patient was rolled into the prone position on the Wilson frame and all pressure points were padded. The lumbar region was cleaned and then prepped with DuraPrep and draped in the usual sterile fashion. 5 cc of local anesthesia was injected and then a dorsal midline incision was made and carried down to the lumbo sacral fascia. The fascia was opened and the paraspinous musculature was taken down  in a subperiosteal fashion to expose L3-4 L4-5 and L5-S1 bilaterally. Intraoperative x-ray confirmed my level, and then I used a combination of the high-speed drill and the Kerrison punches to perform a hemilaminectomy, medial facetectomy, and foraminotomy at L3-4 and L4-5 and L5-S1 on the left. The underlying yellow ligament was opened and removed in a piecemeal fashion to expose the underlying dura and exiting nerve root. I undercut the lateral recess and dissected down until I was medial to and distal to the pedicle. The nerve root was well decompressed. We then gently retracted the nerve root medially with a retractor, coagulated the epidural venous vasculature, and found a large inferior free fragment at L4-5. This was removed with a nerve hook and coronary dilator. It extended down to the L5-S1 level and therefore I performed a left hemilaminectomy at L5-S1. I was then able to work above and below the L5 nerve root to remove the entire fragment. I then performed a right L4-5 hemi-laminectomy medial facetectomy and foraminotomy. I found a subannular disc herniation at L4-5 on the right that did not perform a discectomy. I then palpated with a coronary dilator along the nerve root and into the foramen to assure adequate decompression. I felt no more compression of the nerve root. I irrigated with saline solution containing bacitracin. Achieved hemostasis with bipolar cautery, lined the dura with Gelfoam, and then closed the fascia with 0 Vicryl. I closed the subcutaneous tissues with 2-0 Vicryl and the subcuticular tissues with 3-0 Vicryl. The skin was then closed with benzoin and Steri-Strips. The drapes were removed, a sterile dressing was applied. The patient was awakened from general anesthesia and transferred to the recovery room  in stable condition. At the end of the procedure all sponge, needle and instrument counts were correct.   PLAN OF CARE: Admit to inpatient   PATIENT DISPOSITION:  PACU -  hemodynamically stable.   Delay start of Pharmacological VTE agent (>24hrs) due to surgical blood loss or risk of bleeding:  yes

## 2015-07-21 ENCOUNTER — Encounter (HOSPITAL_COMMUNITY): Payer: Self-pay | Admitting: Neurological Surgery

## 2015-07-25 NOTE — Discharge Summary (Signed)
Physician Discharge Summary  Patient ID: Tina Shaw MRN: [id number] DOB/AGE: 19-Dec-1945 69 y.o.  Admit date: 07/17/2015 Discharge date: 07/25/2015  Admission Diagnoses: Spinal stenosis    Discharge Diagnoses: Same   Discharged Condition: good  Hospital Course: The patient was admitted on 07/17/2015 and taken to the operating room where the patient underwent lumbar laminectomy. The patient tolerated the procedure well and was taken to the recovery room and then to the floor in stable condition. The hospital course was routine. There were no complications. The wound remained clean dry and intact. Pt had appropriate back soreness. No complaints of leg pain or new N/T/W. The patient remained afebrile with stable vital signs, and tolerated a regular diet. The patient continued to increase activities, and pain was well controlled with oral pain medications.   Consults: None  Significant Diagnostic Studies:  Results for orders placed or performed during the hospital encounter of 07/08/15  Surgical pcr screen  Result Value Ref Range   MRSA, PCR NEGATIVE NEGATIVE   Staphylococcus aureus NEGATIVE NEGATIVE  Basic metabolic panel  Result Value Ref Range   Sodium 140 135 - 145 mmol/L   Potassium 3.5 3.5 - 5.1 mmol/L   Chloride 104 101 - 111 mmol/L   CO2 27 22 - 32 mmol/L   Glucose, Bld 97 65 - 99 mg/dL   BUN 9 6 - 20 mg/dL   Creatinine, Ser 4.09 (H) 0.44 - 1.00 mg/dL   Calcium 9.8 8.9 - 81.1 mg/dL   GFR calc non Af Amer 43 (L) >60 mL/min   GFR calc Af Amer 50 (L) >60 mL/min   Anion gap 9 5 - 15  CBC WITH DIFFERENTIAL  Result Value Ref Range   WBC 12.4 (H) 4.0 - 10.5 K/uL   RBC 4.63 3.87 - 5.11 MIL/uL   Hemoglobin 14.0 12.0 - 15.0 g/dL   HCT 91.4 78.2 - 95.6 %   MCV 89.8 78.0 - 100.0 fL   MCH 30.2 26.0 - 34.0 pg   MCHC 33.7 30.0 - 36.0 g/dL   RDW 21.3 08.6 - 57.8 %   Platelets 390 150 - 400 K/uL   Neutrophils Relative % 71 43 - 77 %   Neutro Abs 8.8 (H) 1.7 - 7.7 K/uL   Lymphocytes  Relative 22 12 - 46 %   Lymphs Abs 2.7 0.7 - 4.0 K/uL   Monocytes Relative 6 3 - 12 %   Monocytes Absolute 0.8 0.1 - 1.0 K/uL   Eosinophils Relative 1 0 - 5 %   Eosinophils Absolute 0.1 0.0 - 0.7 K/uL   Basophils Relative 0 0 - 1 %   Basophils Absolute 0.1 0.0 - 0.1 K/uL  Protime-INR  Result Value Ref Range   Prothrombin Time 13.9 11.6 - 15.2 seconds   INR 1.05 0.00 - 1.49    Chest 2 View  07/08/2015   CLINICAL DATA:  Preoperative exam prior to lumbar laminectomy, history of hypertension, gastroesophageal reflux, and previous tobacco use.  EXAM: CHEST  2 VIEW  COMPARISON:  PA and lateral chest x-ray of August 17, 2006  FINDINGS: The lungs are adequately inflated. There is no focal infiltrate. There is no pleural effusion. The heart and pulmonary vascularity are normal. The mediastinum is normal in width. There is no pleural effusion. The bony thorax exhibits no acute abnormality.  IMPRESSION: There is no active cardiopulmonary disease.   Electronically Signed   By: Kazuki Ingle  [place] M.D.   On: 07/08/2015 14:14   Dg Lumbar Spine 1  View  07/17/2015   CLINICAL DATA:  L3-L5 laminectomy  EXAM: LUMBAR SPINE - 1 VIEW  COMPARISON:  Cross-table lateral intraoperative image at 1109 hours correlated with MRI lumbar spine of 06/13/2015  FINDINGS: Prior MRI labeled with 5 lumbar vertebra.  Crossed metallic probes via dorsal approach are identified.  The probe tips project dorsal to the superior L4 level and dorsal to the mid to inferior L5 level.  Scattered degenerative disc disease changes with retrolisthesis at L2-L3.  IMPRESSION: Dorsal localization of the superior L4 and mid inferior L5 levels.   Electronically Signed   By: Ulyses Southward M.D.   On: 07/17/2015 14:03    Antibiotics:  Anti-infectives    Start     Dose/Rate Route Frequency Ordered Stop   07/17/15 1800  ceFAZolin (ANCEF) IVPB 1 g/50 mL premix  Status:  Discontinued     1 g 100 mL/hr over 30 Minutes Intravenous Every 8 hours 07/17/15 1535  07/17/15 2148   07/17/15 1115  bacitracin 50,000 Units in sodium chloride irrigation 0.9 % 500 mL irrigation  Status:  Discontinued       As needed 07/17/15 1115 07/17/15 1247   07/17/15 1030  ceFAZolin (ANCEF) IVPB 2 g/50 mL premix     2 g 100 mL/hr over 30 Minutes Intravenous To [facility] 07/16/15 2158 07/17/15 1035      Discharge Exam: Blood pressure 169/79, pulse 68, temperature 98 F (36.7 C), temperature source Oral, resp. rate 18, height  (1.6 m), weight 223 lb (101.152 kg), SpO2 99 %. Neurologic: Grossly normal Incision clean dry and intact  Discharge Medications:     Medication List    ASK your doctor about these medications        allopurinol 100 MG tablet  Commonly known as:  ZYLOPRIM  Take 100 mg by mouth daily.     Carbinoxamine Maleate 4 MG Tabs  Take 4 mg by mouth 3 (three) times daily as needed (allergies).     cyclobenzaprine 10 MG tablet  Commonly known as:  FLEXERIL  Take 10 mg by mouth 2 (two) times daily as needed for muscle spasms.     diltiazem 240 MG 24 hr capsule  Commonly known as:  CARDIZEM CD  Take 240 mg by mouth daily.     docusate sodium 100 MG capsule  Commonly known as:  COLACE  Take 100 mg by mouth daily.     HYDROcodone-acetaminophen 7.5-325 MG per tablet  Commonly known as:  NORCO  Take 1 tablet by mouth every evening.     lisinopril 20 MG tablet  Commonly known as:  PRINIVIL,ZESTRIL  Take 20 mg by mouth daily.     meclizine 12.5 MG tablet  Commonly known as:  ANTIVERT  Take 12.5 mg by mouth 3 (three) times daily as needed for dizziness.     ranitidine 150 MG tablet  Commonly known as:  ZANTAC  Take 150 mg by mouth 2 (two) times daily.        Disposition: Home  Final Dx: Lumbar laminectomy       Signed: Candas Deemer S 07/25/2015, 3:42 PM

## 2016-02-02 ENCOUNTER — Other Ambulatory Visit: Payer: Self-pay | Admitting: Neurosurgery

## 2016-02-02 DIAGNOSIS — M5416 Radiculopathy, lumbar region: Secondary | ICD-10-CM

## 2016-02-07 ENCOUNTER — Other Ambulatory Visit: Payer: BLUE CROSS/BLUE SHIELD

## 2016-02-13 ENCOUNTER — Ambulatory Visit
Admission: RE | Admit: 2016-02-13 | Discharge: 2016-02-13 | Disposition: A | Discharge status: Home / Self Care | Payer: BLUE CROSS/BLUE SHIELD | Source: Ambulatory Visit | Attending: Neurosurgery | Admitting: Neurosurgery

## 2016-02-13 DIAGNOSIS — M5416 Radiculopathy, lumbar region: Secondary | ICD-10-CM

## 2016-07-12 ENCOUNTER — Other Ambulatory Visit: Payer: Self-pay | Admitting: Neurological Surgery

## 2016-07-20 ENCOUNTER — Encounter (HOSPITAL_COMMUNITY): Payer: Self-pay

## 2016-07-20 ENCOUNTER — Ambulatory Visit (HOSPITAL_COMMUNITY)
Admission: RE | Admit: 2016-07-20 | Discharge: 2016-07-20 | Disposition: A | Discharge status: Home / Self Care | Payer: BLUE CROSS/BLUE SHIELD | Source: Ambulatory Visit | Attending: Neurological Surgery | Admitting: Neurological Surgery

## 2016-07-20 ENCOUNTER — Encounter (HOSPITAL_COMMUNITY)
Admission: RE | Admit: 2016-07-20 | Discharge: 2016-07-20 | Disposition: A | Discharge status: Home / Self Care | Payer: BLUE CROSS/BLUE SHIELD | Source: Ambulatory Visit | Attending: Neurological Surgery | Admitting: Neurological Surgery

## 2016-07-20 DIAGNOSIS — R9431 Abnormal electrocardiogram [ECG] [EKG]: Secondary | ICD-10-CM | POA: Insufficient documentation

## 2016-07-20 DIAGNOSIS — I700 Atherosclerosis of aorta: Secondary | ICD-10-CM | POA: Diagnosis not present

## 2016-07-20 DIAGNOSIS — M48061 Spinal stenosis, lumbar region without neurogenic claudication: Secondary | ICD-10-CM

## 2016-07-20 DIAGNOSIS — M4806 Spinal stenosis, lumbar region: Secondary | ICD-10-CM | POA: Diagnosis not present

## 2016-07-20 DIAGNOSIS — I493 Ventricular premature depolarization: Secondary | ICD-10-CM | POA: Diagnosis not present

## 2016-07-20 LAB — BASIC METABOLIC PANEL
ANION GAP: 8 (ref 5–15)
BUN: 10 mg/dL (ref 6–20)
CALCIUM: 9.9 mg/dL (ref 8.9–10.3)
CO2: 25 mmol/L (ref 22–32)
Chloride: 108 mmol/L (ref 101–111)
Creatinine, Ser: 1.41 mg/dL — ABNORMAL HIGH (ref 0.44–1.00)
GFR, EST AFRICAN AMERICAN: 44 mL/min — AB (ref >60)
GFR, EST NON AFRICAN AMERICAN: 38 mL/min — AB (ref >60)
Glucose, Bld: 89 mg/dL (ref 65–99)
Potassium: 3.7 mmol/L (ref 3.5–5.1)
Sodium: 141 mmol/L (ref 135–145)

## 2016-07-20 LAB — CBC WITH DIFFERENTIAL/PLATELET
BASOS ABS: 0 10*3/uL (ref 0.0–0.1)
BASOS PCT: 0 %
Eosinophils Absolute: 0.2 10*3/uL (ref 0.0–0.7)
Eosinophils Relative: 2 %
HEMATOCRIT: 41.4 % (ref 36.0–46.0)
HEMOGLOBIN: 13.2 g/dL (ref 12.0–15.0)
Lymphocytes Relative: 20 %
Lymphs Abs: 2.5 10*3/uL (ref 0.7–4.0)
MCH: 28.6 pg (ref 26.0–34.0)
MCHC: 31.9 g/dL (ref 30.0–36.0)
MCV: 89.6 fL (ref 78.0–100.0)
Monocytes Absolute: 0.9 10*3/uL (ref 0.1–1.0)
Monocytes Relative: 7 %
NEUTROS ABS: 8.7 10*3/uL — AB (ref 1.7–7.7)
NEUTROS PCT: 71 %
Platelets: 398 10*3/uL (ref 150–400)
RBC: 4.62 MIL/uL (ref 3.87–5.11)
RDW: 14.5 % (ref 11.5–15.5)
WBC: 12.3 10*3/uL — ABNORMAL HIGH (ref 4.0–10.5)

## 2016-07-20 LAB — PROTIME-INR
INR: 1.02
Prothrombin Time: 13.4 seconds (ref 11.4–15.2)

## 2016-07-20 LAB — SURGICAL PCR SCREEN
MRSA, PCR: NEGATIVE
Staphylococcus aureus: NEGATIVE

## 2016-07-20 NOTE — Pre-Procedure Instructions (Signed)
Tina MuskratLinda B Shaw  07/20/2016      [facility] [address] - [place], [place] [address] [place] [address] Phone: [phone] Fax: [phone]    Your procedure is scheduled on September 15  [facility] at [employer]1130 A.M.   Call this number if you have problems the morning of surgery:  [phone]   Remember:  Do not eat food or drink liquids after midnight.   Take these medicines the morning of surgery with A SIP OF WATER allopurinol (zyloprim),Carbinoxamine Maleate, cyclobenzaprine (flexeril), diltiazem (cardizem), fluticasone (flonase), norco If needed, meclizine (antivert), ranitidine (zantac)  7 days prior to surgery STOP taking any Aspirin, Aleve, Naproxen, Ibuprofen, Motrin, Advil, Goody's, BC's, all herbal medications, fish oil, and all vitamins      Do not wear jewelry, make-up or nail polish.  Do not wear lotions, powders, or perfumes, or deoderant.  Do not shave 48 hours prior to surgery.    Do not bring valuables to the hospital.  [facility] is not responsible for any belongings or valuables.  Contacts, dentures or bridgework may not be worn into surgery.  Leave your suitcase in the car.  After surgery it may be brought to your room.  For patients admitted to the hospital, discharge time will be determined by your treatment team.  Patients discharged the day of surgery will not be allowed to drive home.    Special instructions:   [place]- Preparing For Surgery  Before surgery, you can play an important role. Because skin is not sterile, your skin needs to be as free of germs as possible. You can reduce the number of germs on your skin by washing with CHG (chlorahexidine gluconate) Soap before surgery.  CHG is an antiseptic cleaner which kills germs and bonds with the skin to continue killing germs even after washing.  Please do not use if you have an allergy to CHG or antibacterial soaps. If your skin  becomes reddened/irritated stop using the CHG.  Do not shave (including legs and underarms) for at least 48 hours prior to first CHG shower. It is OK to shave your face.  Please follow these instructions carefully.   1. Shower the NIGHT BEFORE SURGERY and the MORNING OF SURGERY with CHG.   2. If you chose to wash your hair, wash your hair first as usual with your normal shampoo.  3. After you shampoo, rinse your hair and body thoroughly to remove the shampoo.  4. Use CHG as you would any other liquid soap. You can apply CHG directly to the skin and wash gently with a scrungie or a clean washcloth.   5. Apply the CHG Soap to your body ONLY FROM THE NECK DOWN.  Do not use on open wounds or open sores. Avoid contact with your eyes, ears, mouth and genitals (private parts). Wash genitals (private parts) with your normal soap.  6. Wash thoroughly, paying special attention to the area where your surgery will be performed.  7. Thoroughly rinse your body with warm water from the neck down.  8. DO NOT shower/wash with your normal soap after using and rinsing off the CHG Soap.  9. Pat yourself dry with a CLEAN TOWEL.   10. Wear CLEAN PAJAMAS   11. Place CLEAN SHEETS on your bed the night of your first shower and DO NOT SLEEP WITH PETS.    Day of Surgery: Do not apply any deodorants/lotions. Please wear clean clothes  to the hospital/surgery center.      Please read over the following fact sheets that you were given. Pain Booklet, MRSA Information and Surgical Site Infection Prevention

## 2016-07-20 NOTE — Progress Notes (Signed)
PCP - Claudean SeveranceBetty Bradley Cardiologist - denies  Chest x-ray - 07/20/16 EKG - 07/20/16 Stress Test - requesting from [facility]  ECHO - denies Cardiac Cath - denies    Patient denies shortness of breath, fever, cough and chest pain at PAT appointment

## 2016-07-28 MED ORDER — DEXAMETHASONE SODIUM PHOSPHATE 10 MG/ML IJ SOLN
10.0000 mg | INTRAMUSCULAR | Status: AC
  Administered 2016-07-29: 10 mg via INTRAVENOUS
  Filled 2016-07-28: qty 1

## 2016-07-28 MED ORDER — CEFAZOLIN SODIUM-DEXTROSE 2-4 GM/100ML-% IV SOLN
2.0000 g | INTRAVENOUS | Status: AC
  Administered 2016-07-29: 2 g via INTRAVENOUS
  Filled 2016-07-28: qty 100

## 2016-07-28 NOTE — Anesthesia Preprocedure Evaluation (Addendum)
Anesthesia Evaluation  Patient identified by MRN, date of birth, ID band Patient awake    Reviewed: Allergy & Precautions, NPO status , Patient's Chart, lab work & pertinent test results  Airway Mallampati: II   Neck ROM: Full    Dental  (+) Dental Advisory Given, Teeth Intact   Pulmonary former smoker,    breath sounds clear to auscultation       Cardiovascular hypertension, Pt. on medications  Rhythm:Regular     Neuro/Psych    GI/Hepatic Neg liver ROS, GERD  ,  Endo/Other  Morbid obesity  Renal/GU Renal InsufficiencyRenal diseaseCreat 1.27     Musculoskeletal  (+) Arthritis ,   Abdominal (+) + obese,   Peds  Hematology negative hematology ROS (+) 14/42   Anesthesia Other Findings   Reproductive/Obstetrics                             Anesthesia Physical  Anesthesia Plan  ASA: III  Anesthesia Plan: General   Post-op Pain Management:    Induction: Intravenous  Airway Management Planned: Oral ETT  Additional Equipment:   Intra-op Plan:   Post-operative Plan: Extubation in OR  Informed Consent: I have reviewed the patients History and Physical, chart, labs and discussed the procedure including the risks, benefits and alternatives for the proposed anesthesia with the patient or authorized representative who has indicated his/her understanding and acceptance.     Plan Discussed with: CRNA  Anesthesia Plan Comments:       Anesthesia Quick Evaluation

## 2016-07-29 ENCOUNTER — Encounter (HOSPITAL_COMMUNITY): Payer: Self-pay | Admitting: *Deleted

## 2016-07-29 ENCOUNTER — Ambulatory Visit (HOSPITAL_COMMUNITY): Payer: BLUE CROSS/BLUE SHIELD | Admitting: Anesthesiology

## 2016-07-29 ENCOUNTER — Encounter (HOSPITAL_COMMUNITY)
Admission: RE | Disposition: A | Discharge status: Home / Self Care | Payer: Self-pay | Source: Ambulatory Visit | Attending: Neurological Surgery

## 2016-07-29 ENCOUNTER — Other Ambulatory Visit: Payer: Self-pay | Admitting: Neurological Surgery

## 2016-07-29 ENCOUNTER — Ambulatory Visit (HOSPITAL_COMMUNITY): Payer: BLUE CROSS/BLUE SHIELD

## 2016-07-29 ENCOUNTER — Ambulatory Visit (HOSPITAL_COMMUNITY)
Admission: RE | Admit: 2016-07-29 | Discharge: 2016-07-29 | Disposition: A | Discharge status: Home / Self Care | Payer: BLUE CROSS/BLUE SHIELD | Source: Ambulatory Visit | Attending: Neurological Surgery | Admitting: Neurological Surgery

## 2016-07-29 DIAGNOSIS — I129 Hypertensive chronic kidney disease with stage 1 through stage 4 chronic kidney disease, or unspecified chronic kidney disease: Secondary | ICD-10-CM | POA: Insufficient documentation

## 2016-07-29 DIAGNOSIS — K5900 Constipation, unspecified: Secondary | ICD-10-CM | POA: Diagnosis not present

## 2016-07-29 DIAGNOSIS — M4806 Spinal stenosis, lumbar region: Secondary | ICD-10-CM | POA: Diagnosis not present

## 2016-07-29 DIAGNOSIS — M109 Gout, unspecified: Secondary | ICD-10-CM | POA: Diagnosis not present

## 2016-07-29 DIAGNOSIS — N183 Chronic kidney disease, stage 3 (moderate): Secondary | ICD-10-CM | POA: Insufficient documentation

## 2016-07-29 DIAGNOSIS — Z79899 Other long term (current) drug therapy: Secondary | ICD-10-CM | POA: Insufficient documentation

## 2016-07-29 DIAGNOSIS — Z87891 Personal history of nicotine dependence: Secondary | ICD-10-CM | POA: Insufficient documentation

## 2016-07-29 DIAGNOSIS — K219 Gastro-esophageal reflux disease without esophagitis: Secondary | ICD-10-CM | POA: Diagnosis not present

## 2016-07-29 DIAGNOSIS — Z6839 Body mass index (BMI) 39.0-39.9, adult: Secondary | ICD-10-CM | POA: Diagnosis not present

## 2016-07-29 DIAGNOSIS — Z419 Encounter for procedure for purposes other than remedying health state, unspecified: Secondary | ICD-10-CM

## 2016-07-29 HISTORY — PX: LUMBAR LAMINECTOMY/DECOMPRESSION MICRODISCECTOMY: SHX5026

## 2016-07-29 SURGERY — LUMBAR LAMINECTOMY/DECOMPRESSION MICRODISCECTOMY 1 LEVEL
Anesthesia: General | Site: Back | Laterality: Right

## 2016-07-29 MED ORDER — SUGAMMADEX SODIUM 500 MG/5ML IV SOLN
INTRAVENOUS | Status: AC
  Filled 2016-07-29: qty 5

## 2016-07-29 MED ORDER — METOPROLOL TARTRATE 5 MG/5ML IV SOLN
INTRAVENOUS | Status: AC
  Filled 2016-07-29: qty 5

## 2016-07-29 MED ORDER — 0.9 % SODIUM CHLORIDE (POUR BTL) OPTIME
TOPICAL | Status: DC | PRN
  Administered 2016-07-29: 1000 mL

## 2016-07-29 MED ORDER — EPHEDRINE 5 MG/ML INJ
INTRAVENOUS | Status: AC
  Filled 2016-07-29: qty 10

## 2016-07-29 MED ORDER — METOPROLOL TARTRATE 5 MG/5ML IV SOLN
INTRAVENOUS | Status: DC | PRN
  Administered 2016-07-29: 2 mg via INTRAVENOUS

## 2016-07-29 MED ORDER — HYDROCODONE-ACETAMINOPHEN 5-325 MG PO TABS
1.0000 | ORAL_TABLET | ORAL | Status: DC | PRN
  Administered 2016-07-29: 2 via ORAL
  Filled 2016-07-29: qty 2

## 2016-07-29 MED ORDER — HYDROCODONE-ACETAMINOPHEN 7.5-325 MG PO TABS: 1.0000 | ORAL_TABLET | Freq: Once | ORAL | Status: DC | PRN

## 2016-07-29 MED ORDER — MIDAZOLAM HCL 2 MG/2ML IJ SOLN
INTRAMUSCULAR | Status: AC
  Filled 2016-07-29: qty 2

## 2016-07-29 MED ORDER — BUPIVACAINE HCL (PF) 0.25 % IJ SOLN
INTRAMUSCULAR | Status: DC | PRN
  Administered 2016-07-29: 10 mL
  Administered 2016-07-29: 3 mL

## 2016-07-29 MED ORDER — FENTANYL CITRATE (PF) 100 MCG/2ML IJ SOLN
INTRAMUSCULAR | Status: DC | PRN
  Administered 2016-07-29 (×4): 50 ug via INTRAVENOUS

## 2016-07-29 MED ORDER — SODIUM CHLORIDE 0.9 % IR SOLN
Status: DC | PRN
  Administered 2016-07-29: 500 mL

## 2016-07-29 MED ORDER — LIDOCAINE 2% (20 MG/ML) 5 ML SYRINGE
INTRAMUSCULAR | Status: AC
  Filled 2016-07-29: qty 15

## 2016-07-29 MED ORDER — ARTIFICIAL TEARS OP OINT
TOPICAL_OINTMENT | OPHTHALMIC | Status: DC | PRN
  Administered 2016-07-29: 1 via OPHTHALMIC

## 2016-07-29 MED ORDER — PHENOL 1.4 % MT LIQD: 1.0000 | OROMUCOSAL | Status: DC | PRN

## 2016-07-29 MED ORDER — MORPHINE SULFATE (PF) 2 MG/ML IV SOLN: 1.0000 mg | INTRAVENOUS | Status: DC | PRN

## 2016-07-29 MED ORDER — CHLORHEXIDINE GLUCONATE CLOTH 2 % EX PADS
6.0000 | MEDICATED_PAD | Freq: Once | CUTANEOUS | Status: DC
Start: 2016-07-29 — End: 2016-07-29

## 2016-07-29 MED ORDER — PHENYLEPHRINE HCL 10 MG/ML IJ SOLN
INTRAMUSCULAR | Status: DC | PRN
  Administered 2016-07-29: 20 ug/min via INTRAVENOUS

## 2016-07-29 MED ORDER — ESMOLOL HCL 100 MG/10ML IV SOLN
INTRAVENOUS | Status: DC | PRN
  Administered 2016-07-29: 10 mg via INTRAVENOUS
  Administered 2016-07-29: 40 mg via INTRAVENOUS
  Administered 2016-07-29: 10 mg via INTRAVENOUS

## 2016-07-29 MED ORDER — CEFAZOLIN IN D5W 1 GM/50ML IV SOLN
1.0000 g | Freq: Three times a day (TID) | INTRAVENOUS | Status: DC
  Administered 2016-07-29: 1 g via INTRAVENOUS
  Filled 2016-07-29: qty 50

## 2016-07-29 MED ORDER — CEFAZOLIN SODIUM 1 G IJ SOLR
INTRAMUSCULAR | Status: AC
  Filled 2016-07-29: qty 40

## 2016-07-29 MED ORDER — ONDANSETRON HCL 4 MG/2ML IJ SOLN
INTRAMUSCULAR | Status: AC
  Filled 2016-07-29: qty 2

## 2016-07-29 MED ORDER — FENTANYL CITRATE (PF) 100 MCG/2ML IJ SOLN
INTRAMUSCULAR | Status: AC
  Filled 2016-07-29: qty 4

## 2016-07-29 MED ORDER — SODIUM CHLORIDE 0.9% FLUSH: 3.0000 mL | Freq: Two times a day (BID) | INTRAVENOUS | Status: DC

## 2016-07-29 MED ORDER — GLYCOPYRROLATE 0.2 MG/ML IV SOSY
PREFILLED_SYRINGE | INTRAVENOUS | Status: AC
  Filled 2016-07-29: qty 6

## 2016-07-29 MED ORDER — SUGAMMADEX SODIUM 500 MG/5ML IV SOLN
INTRAVENOUS | Status: DC | PRN
  Administered 2016-07-29: 410 mg via INTRAVENOUS

## 2016-07-29 MED ORDER — NEOSTIGMINE METHYLSULFATE 5 MG/5ML IV SOSY
PREFILLED_SYRINGE | INTRAVENOUS | Status: AC
  Filled 2016-07-29: qty 10

## 2016-07-29 MED ORDER — GLYCOPYRROLATE 0.2 MG/ML IJ SOLN
INTRAMUSCULAR | Status: DC | PRN
  Administered 2016-07-29: 0.4 mg via INTRAVENOUS

## 2016-07-29 MED ORDER — LIDOCAINE HCL (CARDIAC) 20 MG/ML IV SOLN
INTRAVENOUS | Status: DC | PRN
  Administered 2016-07-29: 60 mg via INTRAVENOUS

## 2016-07-29 MED ORDER — ACETAMINOPHEN 325 MG PO TABS: 650.0000 mg | ORAL_TABLET | ORAL | Status: DC | PRN

## 2016-07-29 MED ORDER — POTASSIUM CHLORIDE IN NACL 20-0.9 MEQ/L-% IV SOLN
INTRAVENOUS | Status: DC
  Filled 2016-07-29: qty 1000

## 2016-07-29 MED ORDER — LISINOPRIL 20 MG PO TABS
20.0000 mg | ORAL_TABLET | Freq: Every day | ORAL | Status: DC
  Administered 2016-07-29: 20 mg via ORAL
  Filled 2016-07-29: qty 1

## 2016-07-29 MED ORDER — PHENYLEPHRINE 40 MCG/ML (10ML) SYRINGE FOR IV PUSH (FOR BLOOD PRESSURE SUPPORT)
PREFILLED_SYRINGE | INTRAVENOUS | Status: AC
  Filled 2016-07-29: qty 10

## 2016-07-29 MED ORDER — PROPOFOL 10 MG/ML IV BOLUS
INTRAVENOUS | Status: DC | PRN
  Administered 2016-07-29: 150 mg via INTRAVENOUS

## 2016-07-29 MED ORDER — ROCURONIUM BROMIDE 100 MG/10ML IV SOLN
INTRAVENOUS | Status: DC | PRN
  Administered 2016-07-29: 30 mg via INTRAVENOUS
  Administered 2016-07-29: 40 mg via INTRAVENOUS

## 2016-07-29 MED ORDER — SODIUM CHLORIDE 0.9% FLUSH: 3.0000 mL | INTRAVENOUS | Status: DC | PRN

## 2016-07-29 MED ORDER — THROMBIN 5000 UNITS EX SOLR
CUTANEOUS | Status: DC | PRN
  Administered 2016-07-29 (×3): 5000 [IU] via TOPICAL

## 2016-07-29 MED ORDER — ONDANSETRON HCL 4 MG/2ML IJ SOLN
INTRAMUSCULAR | Status: DC | PRN
  Administered 2016-07-29: 4 mg via INTRAVENOUS

## 2016-07-29 MED ORDER — FENTANYL CITRATE (PF) 100 MCG/2ML IJ SOLN
INTRAMUSCULAR | Status: AC
  Filled 2016-07-29: qty 2

## 2016-07-29 MED ORDER — ARTIFICIAL TEARS OP OINT
TOPICAL_OINTMENT | OPHTHALMIC | Status: AC
  Filled 2016-07-29: qty 10.5

## 2016-07-29 MED ORDER — PROMETHAZINE HCL 25 MG/ML IJ SOLN
6.2500 mg | INTRAMUSCULAR | Status: DC | PRN
  Administered 2016-07-29: 6.25 mg via INTRAVENOUS

## 2016-07-29 MED ORDER — MIDAZOLAM HCL 5 MG/5ML IJ SOLN
INTRAMUSCULAR | Status: DC | PRN
  Administered 2016-07-29: 1 mg via INTRAVENOUS

## 2016-07-29 MED ORDER — MENTHOL 3 MG MT LOZG: 1.0000 | LOZENGE | OROMUCOSAL | Status: DC | PRN

## 2016-07-29 MED ORDER — GLYCOPYRROLATE 0.2 MG/ML IV SOSY
PREFILLED_SYRINGE | INTRAVENOUS | Status: AC
  Filled 2016-07-29: qty 3

## 2016-07-29 MED ORDER — ESMOLOL HCL 100 MG/10ML IV SOLN
INTRAVENOUS | Status: AC
  Filled 2016-07-29: qty 10

## 2016-07-29 MED ORDER — EPHEDRINE SULFATE 50 MG/ML IJ SOLN
INTRAMUSCULAR | Status: DC | PRN
  Administered 2016-07-29 (×5): 10 mg via INTRAVENOUS

## 2016-07-29 MED ORDER — METHOCARBAMOL 1000 MG/10ML IJ SOLN
500.0000 mg | Freq: Four times a day (QID) | INTRAMUSCULAR | Status: DC | PRN
Start: 2016-07-29 — End: 2016-07-29
  Filled 2016-07-29: qty 5

## 2016-07-29 MED ORDER — ACETAMINOPHEN 650 MG RE SUPP: 650.0000 mg | RECTAL | Status: DC | PRN

## 2016-07-29 MED ORDER — PROPOFOL 10 MG/ML IV BOLUS
INTRAVENOUS | Status: AC
  Filled 2016-07-29: qty 20

## 2016-07-29 MED ORDER — ONDANSETRON HCL 4 MG/2ML IJ SOLN
INTRAMUSCULAR | Status: AC
  Filled 2016-07-29: qty 4

## 2016-07-29 MED ORDER — PHENYLEPHRINE HCL 10 MG/ML IJ SOLN
INTRAMUSCULAR | Status: DC | PRN
  Administered 2016-07-29 (×3): 80 ug via INTRAVENOUS

## 2016-07-29 MED ORDER — METHOCARBAMOL 500 MG PO TABS
500.0000 mg | ORAL_TABLET | Freq: Four times a day (QID) | ORAL | Status: DC | PRN
  Administered 2016-07-29: 500 mg via ORAL
  Filled 2016-07-29: qty 1

## 2016-07-29 MED ORDER — PROMETHAZINE HCL 25 MG/ML IJ SOLN
INTRAMUSCULAR | Status: AC
  Filled 2016-07-29: qty 1

## 2016-07-29 MED ORDER — HYDROMORPHONE HCL 1 MG/ML IJ SOLN: 0.2500 mg | INTRAMUSCULAR | Status: DC | PRN

## 2016-07-29 MED ORDER — SUGAMMADEX SODIUM 500 MG/5ML IV SOLN: INTRAVENOUS | Status: DC | PRN

## 2016-07-29 MED ORDER — ROCURONIUM BROMIDE 10 MG/ML (PF) SYRINGE
PREFILLED_SYRINGE | INTRAVENOUS | Status: AC
Start: 2016-07-29 — End: 2016-07-29
  Filled 2016-07-29: qty 30

## 2016-07-29 MED ORDER — ONDANSETRON HCL 4 MG/2ML IJ SOLN: 4.0000 mg | INTRAMUSCULAR | Status: DC | PRN

## 2016-07-29 MED ORDER — DILTIAZEM HCL ER COATED BEADS 120 MG PO CP24: 240.0000 mg | ORAL_CAPSULE | Freq: Every day | ORAL | Status: DC

## 2016-07-29 MED ORDER — LACTATED RINGERS IV SOLN
INTRAVENOUS | Status: DC | PRN
  Administered 2016-07-29 (×2): via INTRAVENOUS

## 2016-07-29 SURGICAL SUPPLY — 40 items
BAG DECANTER FOR FLEXI CONT (MISCELLANEOUS) ×3 IMPLANT
BENZOIN TINCTURE PRP APPL 2/3 (GAUZE/BANDAGES/DRESSINGS) ×3 IMPLANT
BUR MATCHSTICK NEURO 3.0 LAGG (BURR) ×3 IMPLANT
CANISTER SUCT 3000ML PPV (MISCELLANEOUS) ×3 IMPLANT
CLOSURE WOUND 1/2 X4 (GAUZE/BANDAGES/DRESSINGS) ×1
DERMABOND ADVANCED (GAUZE/BANDAGES/DRESSINGS) ×2
DERMABOND ADVANCED .7 DNX12 (GAUZE/BANDAGES/DRESSINGS) ×1 IMPLANT
DRAPE LAPAROTOMY 100X72X124 (DRAPES) ×3 IMPLANT
DRAPE MICROSCOPE LEICA (MISCELLANEOUS) ×3 IMPLANT
DRAPE POUCH INSTRU U-SHP 10X18 (DRAPES) ×3 IMPLANT
DRAPE SURG 17X23 STRL (DRAPES) ×3 IMPLANT
DRSG OPSITE POSTOP 3X4 (GAUZE/BANDAGES/DRESSINGS) ×3 IMPLANT
DURAPREP 26ML APPLICATOR (WOUND CARE) ×3 IMPLANT
ELECT REM PT RETURN 9FT ADLT (ELECTROSURGICAL) ×3
ELECTRODE REM PT RTRN 9FT ADLT (ELECTROSURGICAL) ×1 IMPLANT
GAUZE SPONGE 4X4 16PLY XRAY LF (GAUZE/BANDAGES/DRESSINGS) IMPLANT
GLOVE BIO SURGEON STRL SZ8 (GLOVE) ×3 IMPLANT
GOWN STRL REUS W/ TWL LRG LVL3 (GOWN DISPOSABLE) IMPLANT
GOWN STRL REUS W/ TWL XL LVL3 (GOWN DISPOSABLE) ×1 IMPLANT
GOWN STRL REUS W/TWL 2XL LVL3 (GOWN DISPOSABLE) IMPLANT
GOWN STRL REUS W/TWL LRG LVL3 (GOWN DISPOSABLE)
GOWN STRL REUS W/TWL XL LVL3 (GOWN DISPOSABLE) ×2
HEMOSTAT POWDER KIT SURGIFOAM (HEMOSTASIS) ×3 IMPLANT
KIT BASIN OR (CUSTOM PROCEDURE TRAY) ×3 IMPLANT
KIT ROOM TURNOVER OR (KITS) ×3 IMPLANT
NEEDLE HYPO 25X1 1.5 SAFETY (NEEDLE) ×3 IMPLANT
NEEDLE SPNL 20GX3.5 QUINCKE YW (NEEDLE) IMPLANT
NS IRRIG 1000ML POUR BTL (IV SOLUTION) ×3 IMPLANT
PACK LAMINECTOMY NEURO (CUSTOM PROCEDURE TRAY) ×3 IMPLANT
PAD ARMBOARD 7.5X6 YLW CONV (MISCELLANEOUS) ×9 IMPLANT
RUBBERBAND STERILE (MISCELLANEOUS) ×6 IMPLANT
SPONGE SURGIFOAM ABS GEL SZ50 (HEMOSTASIS) ×3 IMPLANT
STRIP CLOSURE SKIN 1/2X4 (GAUZE/BANDAGES/DRESSINGS) ×2 IMPLANT
SUT VIC AB 0 CT1 18XCR BRD8 (SUTURE) ×1 IMPLANT
SUT VIC AB 0 CT1 8-18 (SUTURE) ×2
SUT VIC AB 2-0 CP2 18 (SUTURE) ×3 IMPLANT
SUT VIC AB 3-0 SH 8-18 (SUTURE) ×9 IMPLANT
TOWEL OR 17X24 6PK STRL BLUE (TOWEL DISPOSABLE) ×3 IMPLANT
TOWEL OR 17X26 10 PK STRL BLUE (TOWEL DISPOSABLE) ×3 IMPLANT
WATER STERILE IRR 1000ML POUR (IV SOLUTION) ×3 IMPLANT

## 2016-07-29 NOTE — Progress Notes (Signed)
Pt. discharged home accompanied by husband. Prescriptions and discharge instructions given with verbalization of understanding. Incision site on back with no s/s of infection - no swelling, redness, bleeding, and/or drainage noted. Opportunity given to ask questions but no question asked. Pt. transported out of this unit in wheelchair by the volunteer   

## 2016-07-29 NOTE — H&P (Signed)
Subjective: [name] Onset of symptoms was several months ago, gradually worsening since that time.  The pain is rated severe, and is located at the across the lower back and radiates to R leg. The pain is described as aching and occurs intermittently. The symptoms have been progressive. Symptoms are exacerbated by exercise. MRI or CT showed stenosis   Past Medical History:  Diagnosis Date  . Arthritis   . Bruises easily   . Cataracts, bilateral   . Chronic back pain    scoliosis  . Chronic kidney disease    stage 3 kidney disease  . Constipation    takes Colace daily  . Dysphagia   . GERD (gastroesophageal reflux disease)    takes Zantac daily  . Gout    takes Allopurinol daily  . Hypertension    takes Lisinopril and Diltiazem daily   . Joint pain   . Muscle spasm    takes Flexeril daily as needed  . Nocturia   . Osteoporosis   . Urinary frequency   . Vertigo    takes Meclizine daily as needed  . Weakness    numbness on the left leg    Past Surgical History:  Procedure Laterality Date  . BACK SURGERY  2007  . CARPAL TUNNEL RELEASE Bilateral   . CHOLECYSTECTOMY  04/2015  . COLONOSCOPY    . COLONOSCOPY WITH ESOPHAGOGASTRODUODENOSCOPY (EGD) AND ESOPHAGEAL DILATION (ED)    . DILATION AND CURETTAGE OF UTERUS    . EYE SURGERY     cataracts bilateral march then april  . LUMBAR LAMINECTOMY/DECOMPRESSION MICRODISCECTOMY Bilateral 07/17/2015   Procedure: LUMBAR LAMINECTOMY/DECOMPRESSION MICRODISCECTOMY 2 LEVELS;  Surgeon: Tina Alertavid Shaw Shakera Ebrahimi, MD;  Location: [facility];  Service: Neurosurgery;  Laterality: Bilateral;  Laminectomy and foraminotomy bilateral lumbar three-lumbar four, lumbar four-lumbar five, left lumbar five, sacral one  . TONSILLECTOMY  at age 70    Prior to Admission medications   Medication Sig Start Date End Date Taking? Authorizing Provider  allopurinol (ZYLOPRIM) 100 MG tablet Take 100 mg by mouth daily.   Yes Historical  Provider, MD  Carbinoxamine Maleate 4 MG TABS Take 4 mg by mouth 3 (three) times daily as needed (allergies).   Yes Historical Provider, MD  cyclobenzaprine (FLEXERIL) 10 MG tablet Take 10 mg by mouth 2 (two) times daily as needed for muscle spasms.   Yes Historical Provider, MD  diltiazem (CARDIZEM CD) 240 MG 24 hr capsule Take 240 mg by mouth daily.   Yes Historical Provider, MD  docusate sodium (COLACE) 100 MG capsule Take 100 mg by mouth daily.   Yes Historical Provider, MD  fluticasone (FLONASE) 50 MCG/ACT nasal spray Place 1 spray into both nostrils daily as needed for allergies or rhinitis.   Yes Historical Provider, MD  HYDROcodone-acetaminophen (NORCO) 7.5-325 MG per tablet Take 1 tablet by mouth every evening.   Yes Historical Provider, MD  lisinopril (PRINIVIL,ZESTRIL) 20 MG tablet Take 20 mg by mouth daily.   Yes Historical Provider, MD  meclizine (ANTIVERT) 12.5 MG tablet Take 12.5 mg by mouth 3 (three) times daily as needed for dizziness.   Yes Historical Provider, MD  ranitidine (ZANTAC) 150 MG tablet Take 150 mg by mouth 2 (two) times daily.   Yes Historical Provider, MD   Allergies  Allergen Reactions  . No Known Allergies     Social History  Substance Use Topics  . Smoking status: Former Games developermoker  . Smokeless tobacco: Never  Used     Comment: quit smoking 30+yrs ago  . Alcohol use No    History reviewed. No pertinent family history.   Review of Systems  Positive ROS: neg  All other systems have been reviewed and were otherwise negative with the exception of those mentioned in the HPI and as above.  Objective: Vital signs in last 24 hours: Temp:  [97.9 F (36.6 C)] 97.9 F (36.6 C) (09/15 0652) Pulse Rate:  [70] 70 (09/15 0636) Resp:  [20] 20 (09/15 0636) BP: (158)/(69) 158/69 (09/15 0636) SpO2:  [100 %] 100 % (09/15 0636) Weight:  [101.6 kg (224 lb)] 101.6 kg (224 lb) (09/15 0636)  General Appearance: Alert, cooperative, no distress, appears stated age Head:  Normocephalic, without obvious abnormality, atraumatic Eyes: PERRL, conjunctiva/corneas clear, EOM'Shaw intact    Neck: Supple, symmetrical, trachea midline Back: Symmetric, no curvature, ROM normal, no CVA tenderness Lungs:  respirations unlabored Heart: Regular rate and rhythm Abdomen: Soft, non-tender Extremities: Extremities normal, atraumatic, no cyanosis or edema Pulses: 2+ and symmetric all extremities Skin: Skin color, texture, turgor normal, no rashes or lesions  NEUROLOGIC:   Mental status: Alert and oriented x4,  no aphasia, good attention span, fund of knowledge, and memory Motor Exam - grossly normal Sensory Exam - grossly normal Reflexes: 1+ Coordination - grossly normal Gait - grossly normal Balance - grossly normal Cranial Nerves: I: smell Not tested  II: visual acuity  OS: nl    OD: nl  II: visual fields Full to confrontation  II: pupils Equal, round, reactive to light  III,VII: ptosis None  III,IV,VI: extraocular muscles  Full ROM  V: mastication Normal  V: facial light touch sensation  Normal  V,VII: corneal reflex  Present  VII: facial muscle function - upper  Normal  VII: facial muscle function - lower Normal  VIII: hearing Not tested  IX: soft palate elevation  Normal  IX,X: gag reflex Present  XI: trapezius strength  5/5  XI: sternocleidomastoid strength 5/5  XI: neck flexion strength  5/5  XII: tongue strength  Normal    Data Review Lab Results  Component Value Date   WBC 12.3 (H) 07/20/2016   HGB 13.2 07/20/2016   HCT 41.4 07/20/2016   MCV 89.6 07/20/2016   PLT 398 07/20/2016   Lab Results  Component Value Date   NA 141 07/20/2016   K 3.7 07/20/2016   CL 108 07/20/2016   CO2 25 07/20/2016   BUN 10 07/20/2016   CREATININE 1.41 (H) 07/20/2016   GLUCOSE 89 07/20/2016   Lab Results  Component Value Date   INR 1.02 07/20/2016    Assessment/Plan: Patient admitted for R L3-4. Patient has failed a reasonable attempt at conservative  therapy.  I explained the condition and procedure to the patient and answered any questions.  Patient wishes to proceed with procedure as planned. Understands risks/ benefits and typical outcomes of procedure.   Tina Shaw 07/29/2016 7:28 AM

## 2016-07-29 NOTE — Anesthesia Postprocedure Evaluation (Signed)
Anesthesia Post Note  Patient: Tina MuskratLinda B Coletti  Procedure(s) Performed: Procedure(s) (LRB): Laminectomy and Foraminotomy - L3-L4 - right (Right)  Patient location during evaluation: PACU Anesthesia Type: General Level of consciousness: awake and alert Pain management: pain level controlled Vital Signs Assessment: post-procedure vital signs reviewed and stable Respiratory status: spontaneous breathing, nonlabored ventilation, respiratory function stable and patient connected to nasal cannula oxygen Cardiovascular status: blood pressure returned to baseline and stable Postop Assessment: no signs of nausea or vomiting Anesthetic complications: no    Last Vitals:  Vitals:   07/29/16 1036 07/29/16 1133  BP:  132/66  Pulse: 74 68  Resp: (!) 28 18  Temp: 36.4 C 36.5 C    Last Pain:  Vitals:   07/29/16 0636  TempSrc: Oral  PainSc: 4                  Kennieth RadFitzgerald, Harper Smoker E

## 2016-07-29 NOTE — Discharge Summary (Signed)
Physician Discharge Summary  Patient ID: Tina MuskratLinda B Shaw MRN: [id number] DOB/AGE: 68/02/1946 68 y.o.  Admit date: 07/29/2016 Discharge date: 07/29/2016  Admission Diagnoses: lumbar stenosis   Discharge Diagnoses: same   Discharged Condition: good  Hospital Course: The patient was admitted on 07/29/2016 and taken to the operating room where the patient underwent lum lam L3-4. The patient tolerated the procedure well and was taken to the recovery room and then to the floor in stable condition. The hospital course was routine. There were no complications. The wound remained clean dry and intact. Pt had appropriate back soreness. No complaints of leg pain or new N/T/W. The patient remained afebrile with stable vital signs, and tolerated a regular diet. The patient continued to increase activities, and pain was well controlled with oral pain medications.   Consults: None  Significant Diagnostic Studies:  Results for orders placed or performed during the hospital encounter of 07/20/16  Surgical pcr screen  Result Value Ref Range   MRSA, PCR NEGATIVE NEGATIVE   Staphylococcus aureus NEGATIVE NEGATIVE  Basic metabolic panel  Result Value Ref Range   Sodium 141 135 - 145 mmol/L   Potassium 3.7 3.5 - 5.1 mmol/L   Chloride 108 101 - 111 mmol/L   CO2 25 22 - 32 mmol/L   Glucose, Bld 89 65 - 99 mg/dL   BUN 10 6 - 20 mg/dL   Creatinine, Ser 4.091.41 (H) 0.44 - 1.00 mg/dL   Calcium 9.9 8.9 - 81.110.3 mg/dL   GFR calc non Af Amer 38 (L) >60 mL/min   GFR calc Af Amer 44 (L) >60 mL/min   Anion gap 8 5 - 15  CBC WITH DIFFERENTIAL  Result Value Ref Range   WBC 12.3 (H) 4.0 - 10.5 K/uL   RBC 4.62 3.87 - 5.11 MIL/uL   Hemoglobin 13.2 12.0 - 15.0 g/dL   HCT 91.441.4 78.236.0 - 95.646.0 %   MCV 89.6 78.0 - 100.0 fL   MCH 28.6 26.0 - 34.0 pg   MCHC 31.9 30.0 - 36.0 g/dL   RDW 21.314.5 08.611.5 - 57.815.5 %   Platelets 398 150 - 400 K/uL   Neutrophils Relative % 71 %   Neutro Abs 8.7 (H) 1.7 - 7.7 K/uL   Lymphocytes Relative 20  %   Lymphs Abs 2.5 0.7 - 4.0 K/uL   Monocytes Relative 7 %   Monocytes Absolute 0.9 0.1 - 1.0 K/uL   Eosinophils Relative 2 %   Eosinophils Absolute 0.2 0.0 - 0.7 K/uL   Basophils Relative 0 %   Basophils Absolute 0.0 0.0 - 0.1 K/uL  Protime-INR  Result Value Ref Range   Prothrombin Time 13.4 11.4 - 15.2 seconds   INR 1.02     Chest 2 View  Result Date: 07/20/2016 CLINICAL DATA:  Patient is scheduled for L3-4 surgery to correct spinal stenosis on 09/15. History of hypertension. Patient is a former smoker. EXAM: CHEST  2 VIEW COMPARISON:  07/08/2015 FINDINGS: PA and lateral views chest. There is no focal pulmonary infiltrate, consolidation, or pleural effusion identified. Cardiomediastinal silhouette is within normal limits. There is atherosclerosis of the aorta. There is no pneumothorax. There are degenerative osteophytes of the thoracic spine. IMPRESSION: 1.  No radiographic evidence for acute cardiothoracic abnormality. 2.  Atherosclerotic calcification of the aorta. Electronically Signed   By: Tina PangKim  Shaw M.D.   On: 07/20/2016 14:58   Dg Lumbar Spine 2-3 Views  Result Date: 07/29/2016 CLINICAL DATA:  Opening films for L3-4 laminectomy. EXAM: LUMBAR SPINE -  2-3 VIEW COMPARISON:  Lumbar MRI 02/13/2016.  Radiographs 01/26/2016. FINDINGS: Two cross-table lateral views of the lumbar spine are submitted from the operating room. On the first image obtained at 0800 hours, there is a posterior localizing needle between the L2 and L3 spinous processes. On the second image obtained at 0819 hours, there is a blunt surgical instrument directed more inferiorly towards the posterior aspect of the L3-4 disc space. Skin spreaders are in place. The spinal alignment and multilevel spondylosis appear grossly unchanged. IMPRESSION: Intraoperative localization as described. Electronically Signed   By: Tina Shaw M.D.   On: 07/29/2016 10:00    Antibiotics:  Anti-infectives    Start     Dose/Rate Route  Frequency Ordered Stop   07/29/16 1400  ceFAZolin (ANCEF) IVPB 1 g/50 mL premix     1 g 100 mL/hr over 30 Minutes Intravenous Every 8 hours 07/29/16 1156 07/30/16 0559   07/29/16 0730  ceFAZolin (ANCEF) IVPB 2g/100 mL premix     2 g 200 mL/hr over 30 Minutes Intravenous To [facility] 07/28/16 1415 07/29/16 0726   07/29/16 0721  bacitracin 50,000 Units in sodium chloride irrigation 0.9 % 500 mL irrigation  Status:  Discontinued       As needed 07/29/16 0722 07/29/16 0935      Discharge Exam: Blood pressure 132/66, pulse 68, temperature 97.7 F (36.5 C), resp. rate 18, height 5\' 3"  (1.6 m), weight 101.6 kg (224 lb), SpO2 100 %. Neuro normal Dressing dry Discharge Medications:  See orders  Disposition: home   Final Dx: lum lam       Signed: Dishon Kehoe Shaw 07/29/2016, 8:55 PM

## 2016-07-29 NOTE — Op Note (Signed)
07/29/2016  9:43 AM  PATIENT:  Tina MuskratLinda B Ellner  70 y.o. 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 She presented with right leg pain. She had severe recurrent lateral recess stenosis L3-4 on the right. She tried medical management without relief. I recommended redo right L3-4 decompressive hemilaminectomy. Patient understood the risks, benefits, and alternatives and potential outcomes and wished to proceed.  PROCEDURE DETAILS: The patient was taken to the operating room and after induction of adequate generalized endotracheal anesthesia, the patient was rolled into the prone position on the Wilson frame and all pressure points were padded. The lumbar region was cleaned and then prepped with DuraPrep and draped in the usual sterile fashion. 5 cc of local anesthesia was injected and then a dorsal midline incision was made and carried down to the lumbo sacral fascia. The fascia was opened and the paraspinous musculature was taken down in a subperiosteal fashion to expose L3-4 on the right. Intraoperative x-ray confirmed my level, and then I used a combination of the high-speed drill and the Kerrison punches to perform a hemilaminectomy, medial facetectomy, and foraminotomy at L3-4 on the right. 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 I had to extend the laminotomy superiorly to find normal tissue and then worked through the lateral recess to find the lateral edge of the dura. I then  marched inferiorly until I was at the pedicle and decompress the right L3 nerve root in the foramen.. The nerve root was well decompressed. We then gently retracted the nerve root medially with a retractor, coagulated the epidural venous vasculature, and inspected the disc space. I found no significant disc herniation. I then palpated with a coronary dilator along the nerve root and into the foramen to assure adequate decompression. I felt no more compression of the nerve root. I irrigated with saline solution containing bacitracin. Achieved hemostasis with bipolar cautery, lined the dura with Gelfoam, and then closed the fascia with 0 Vicryl. I closed the subcutaneous tissues with 2-0 Vicryl and the subcuticular tissues with 3-0 Vicryl. The skin was then closed with benzoin and Steri-Strips. The drapes were removed, a sterile dressing was applied. The patient was awakened from general anesthesia and transferred to the recovery room in stable condition. At the end of the procedure all sponge, needle and instrument counts were correct.   PLAN OF CARE: Discharge to home after PACU  PATIENT DISPOSITION:  PACU - hemodynamically stable.   Delay start of Pharmacological VTE agent (>24hrs) due to surgical blood loss or risk of bleeding:  yes

## 2016-07-29 NOTE — Transfer of Care (Signed)
Immediate Anesthesia Transfer of Care Note  Patient: Tina MuskratLinda B Shaw  Procedure(s) Performed: Procedure(s): Laminectomy and Foraminotomy - L3-L4 - right (Right)  Patient Location: PACU  Anesthesia Type:General  Level of Consciousness: awake, alert , oriented and patient cooperative  Airway & Oxygen Therapy: Patient Spontanous Breathing and Patient connected to nasal cannula oxygen  Post-op Assessment: Report given to RN and Post -op Vital signs reviewed and stable  Post vital signs: Reviewed and stable  Last Vitals:  Vitals:   07/29/16 0652 07/29/16 0941  BP:    Pulse:  78  Resp:    Temp: 36.6 C 36.2 C    Last Pain:  Vitals:   07/29/16 0636  TempSrc: Oral  PainSc: 4       Patients Stated Pain Goal: 4 (07/29/16 0636)  Complications: No apparent anesthesia complications

## 2016-08-01 ENCOUNTER — Encounter (HOSPITAL_COMMUNITY): Payer: Self-pay | Admitting: Neurological Surgery

## 2016-12-30 DIAGNOSIS — I499 Cardiac arrhythmia, unspecified: Secondary | ICD-10-CM | POA: Diagnosis not present

## 2016-12-30 DIAGNOSIS — J0191 Acute recurrent sinusitis, unspecified: Secondary | ICD-10-CM | POA: Diagnosis not present

## 2016-12-30 DIAGNOSIS — R0602 Shortness of breath: Secondary | ICD-10-CM | POA: Diagnosis not present

## 2016-12-30 DIAGNOSIS — I4891 Unspecified atrial fibrillation: Secondary | ICD-10-CM | POA: Diagnosis not present

## 2017-01-04 DIAGNOSIS — H2703 Aphakia, bilateral: Secondary | ICD-10-CM | POA: Diagnosis not present

## 2017-01-12 DIAGNOSIS — R600 Localized edema: Secondary | ICD-10-CM | POA: Diagnosis not present

## 2017-01-12 DIAGNOSIS — I10 Essential (primary) hypertension: Secondary | ICD-10-CM | POA: Diagnosis not present

## 2017-01-12 DIAGNOSIS — I491 Atrial premature depolarization: Secondary | ICD-10-CM | POA: Diagnosis not present

## 2017-01-12 DIAGNOSIS — R0609 Other forms of dyspnea: Secondary | ICD-10-CM | POA: Diagnosis not present

## 2017-01-12 DIAGNOSIS — Z7689 Persons encountering health services in other specified circumstances: Secondary | ICD-10-CM | POA: Diagnosis not present

## 2017-02-01 DIAGNOSIS — I491 Atrial premature depolarization: Secondary | ICD-10-CM | POA: Diagnosis not present

## 2017-02-01 DIAGNOSIS — I10 Essential (primary) hypertension: Secondary | ICD-10-CM | POA: Diagnosis not present

## 2017-02-01 DIAGNOSIS — R0609 Other forms of dyspnea: Secondary | ICD-10-CM | POA: Diagnosis not present

## 2017-02-01 DIAGNOSIS — R600 Localized edema: Secondary | ICD-10-CM | POA: Diagnosis not present

## 2017-02-06 DIAGNOSIS — I491 Atrial premature depolarization: Secondary | ICD-10-CM | POA: Diagnosis not present

## 2017-02-06 DIAGNOSIS — I10 Essential (primary) hypertension: Secondary | ICD-10-CM | POA: Diagnosis not present

## 2017-02-06 DIAGNOSIS — Z6839 Body mass index (BMI) 39.0-39.9, adult: Secondary | ICD-10-CM | POA: Diagnosis not present

## 2017-02-06 DIAGNOSIS — R600 Localized edema: Secondary | ICD-10-CM | POA: Diagnosis not present

## 2017-02-28 DIAGNOSIS — I129 Hypertensive chronic kidney disease with stage 1 through stage 4 chronic kidney disease, or unspecified chronic kidney disease: Secondary | ICD-10-CM | POA: Diagnosis not present

## 2017-02-28 DIAGNOSIS — D631 Anemia in chronic kidney disease: Secondary | ICD-10-CM | POA: Diagnosis not present

## 2017-02-28 DIAGNOSIS — N183 Chronic kidney disease, stage 3 (moderate): Secondary | ICD-10-CM | POA: Diagnosis not present

## 2017-02-28 DIAGNOSIS — E559 Vitamin D deficiency, unspecified: Secondary | ICD-10-CM | POA: Diagnosis not present

## 2017-03-01 DIAGNOSIS — M5442 Lumbago with sciatica, left side: Secondary | ICD-10-CM | POA: Diagnosis not present

## 2017-03-01 DIAGNOSIS — M5136 Other intervertebral disc degeneration, lumbar region: Secondary | ICD-10-CM | POA: Diagnosis not present

## 2017-03-01 DIAGNOSIS — M9903 Segmental and somatic dysfunction of lumbar region: Secondary | ICD-10-CM | POA: Diagnosis not present

## 2017-03-01 DIAGNOSIS — M9904 Segmental and somatic dysfunction of sacral region: Secondary | ICD-10-CM | POA: Diagnosis not present

## 2017-03-02 DIAGNOSIS — M5442 Lumbago with sciatica, left side: Secondary | ICD-10-CM | POA: Diagnosis not present

## 2017-03-02 DIAGNOSIS — M9903 Segmental and somatic dysfunction of lumbar region: Secondary | ICD-10-CM | POA: Diagnosis not present

## 2017-03-02 DIAGNOSIS — M9904 Segmental and somatic dysfunction of sacral region: Secondary | ICD-10-CM | POA: Diagnosis not present

## 2017-03-02 DIAGNOSIS — M5136 Other intervertebral disc degeneration, lumbar region: Secondary | ICD-10-CM | POA: Diagnosis not present

## 2017-03-06 DIAGNOSIS — M5442 Lumbago with sciatica, left side: Secondary | ICD-10-CM | POA: Diagnosis not present

## 2017-03-06 DIAGNOSIS — M9904 Segmental and somatic dysfunction of sacral region: Secondary | ICD-10-CM | POA: Diagnosis not present

## 2017-03-06 DIAGNOSIS — M5136 Other intervertebral disc degeneration, lumbar region: Secondary | ICD-10-CM | POA: Diagnosis not present

## 2017-03-06 DIAGNOSIS — M9903 Segmental and somatic dysfunction of lumbar region: Secondary | ICD-10-CM | POA: Diagnosis not present

## 2017-03-07 DIAGNOSIS — M9903 Segmental and somatic dysfunction of lumbar region: Secondary | ICD-10-CM | POA: Diagnosis not present

## 2017-03-07 DIAGNOSIS — Z6839 Body mass index (BMI) 39.0-39.9, adult: Secondary | ICD-10-CM | POA: Diagnosis not present

## 2017-03-07 DIAGNOSIS — M5416 Radiculopathy, lumbar region: Secondary | ICD-10-CM | POA: Diagnosis not present

## 2017-03-07 DIAGNOSIS — M545 Low back pain: Secondary | ICD-10-CM | POA: Diagnosis not present

## 2017-03-07 DIAGNOSIS — M5136 Other intervertebral disc degeneration, lumbar region: Secondary | ICD-10-CM | POA: Diagnosis not present

## 2017-03-07 DIAGNOSIS — M9904 Segmental and somatic dysfunction of sacral region: Secondary | ICD-10-CM | POA: Diagnosis not present

## 2017-03-07 DIAGNOSIS — M5442 Lumbago with sciatica, left side: Secondary | ICD-10-CM | POA: Diagnosis not present

## 2017-03-07 DIAGNOSIS — I10 Essential (primary) hypertension: Secondary | ICD-10-CM | POA: Diagnosis not present

## 2017-03-08 DIAGNOSIS — M5442 Lumbago with sciatica, left side: Secondary | ICD-10-CM | POA: Diagnosis not present

## 2017-03-08 DIAGNOSIS — M9903 Segmental and somatic dysfunction of lumbar region: Secondary | ICD-10-CM | POA: Diagnosis not present

## 2017-03-08 DIAGNOSIS — M9904 Segmental and somatic dysfunction of sacral region: Secondary | ICD-10-CM | POA: Diagnosis not present

## 2017-03-08 DIAGNOSIS — M5136 Other intervertebral disc degeneration, lumbar region: Secondary | ICD-10-CM | POA: Diagnosis not present

## 2017-03-13 DIAGNOSIS — M5442 Lumbago with sciatica, left side: Secondary | ICD-10-CM | POA: Diagnosis not present

## 2017-03-13 DIAGNOSIS — M9904 Segmental and somatic dysfunction of sacral region: Secondary | ICD-10-CM | POA: Diagnosis not present

## 2017-03-13 DIAGNOSIS — M5136 Other intervertebral disc degeneration, lumbar region: Secondary | ICD-10-CM | POA: Diagnosis not present

## 2017-03-13 DIAGNOSIS — M9903 Segmental and somatic dysfunction of lumbar region: Secondary | ICD-10-CM | POA: Diagnosis not present

## 2017-03-14 DIAGNOSIS — M5442 Lumbago with sciatica, left side: Secondary | ICD-10-CM | POA: Diagnosis not present

## 2017-03-14 DIAGNOSIS — M9904 Segmental and somatic dysfunction of sacral region: Secondary | ICD-10-CM | POA: Diagnosis not present

## 2017-03-14 DIAGNOSIS — M5136 Other intervertebral disc degeneration, lumbar region: Secondary | ICD-10-CM | POA: Diagnosis not present

## 2017-03-14 DIAGNOSIS — M9903 Segmental and somatic dysfunction of lumbar region: Secondary | ICD-10-CM | POA: Diagnosis not present

## 2017-03-15 DIAGNOSIS — M5136 Other intervertebral disc degeneration, lumbar region: Secondary | ICD-10-CM | POA: Diagnosis not present

## 2017-03-15 DIAGNOSIS — M5442 Lumbago with sciatica, left side: Secondary | ICD-10-CM | POA: Diagnosis not present

## 2017-03-15 DIAGNOSIS — M9903 Segmental and somatic dysfunction of lumbar region: Secondary | ICD-10-CM | POA: Diagnosis not present

## 2017-03-15 DIAGNOSIS — M9904 Segmental and somatic dysfunction of sacral region: Secondary | ICD-10-CM | POA: Diagnosis not present

## 2017-03-20 DIAGNOSIS — M5442 Lumbago with sciatica, left side: Secondary | ICD-10-CM | POA: Diagnosis not present

## 2017-03-20 DIAGNOSIS — M5136 Other intervertebral disc degeneration, lumbar region: Secondary | ICD-10-CM | POA: Diagnosis not present

## 2017-03-20 DIAGNOSIS — M9903 Segmental and somatic dysfunction of lumbar region: Secondary | ICD-10-CM | POA: Diagnosis not present

## 2017-03-20 DIAGNOSIS — M9904 Segmental and somatic dysfunction of sacral region: Secondary | ICD-10-CM | POA: Diagnosis not present

## 2017-03-21 DIAGNOSIS — M9904 Segmental and somatic dysfunction of sacral region: Secondary | ICD-10-CM | POA: Diagnosis not present

## 2017-03-21 DIAGNOSIS — M9903 Segmental and somatic dysfunction of lumbar region: Secondary | ICD-10-CM | POA: Diagnosis not present

## 2017-03-21 DIAGNOSIS — M5136 Other intervertebral disc degeneration, lumbar region: Secondary | ICD-10-CM | POA: Diagnosis not present

## 2017-03-21 DIAGNOSIS — M5442 Lumbago with sciatica, left side: Secondary | ICD-10-CM | POA: Diagnosis not present

## 2017-03-22 DIAGNOSIS — M9903 Segmental and somatic dysfunction of lumbar region: Secondary | ICD-10-CM | POA: Diagnosis not present

## 2017-03-22 DIAGNOSIS — M5442 Lumbago with sciatica, left side: Secondary | ICD-10-CM | POA: Diagnosis not present

## 2017-03-22 DIAGNOSIS — M5136 Other intervertebral disc degeneration, lumbar region: Secondary | ICD-10-CM | POA: Diagnosis not present

## 2017-03-22 DIAGNOSIS — M9904 Segmental and somatic dysfunction of sacral region: Secondary | ICD-10-CM | POA: Diagnosis not present

## 2017-03-27 DIAGNOSIS — M5136 Other intervertebral disc degeneration, lumbar region: Secondary | ICD-10-CM | POA: Diagnosis not present

## 2017-03-27 DIAGNOSIS — M9903 Segmental and somatic dysfunction of lumbar region: Secondary | ICD-10-CM | POA: Diagnosis not present

## 2017-03-27 DIAGNOSIS — M9904 Segmental and somatic dysfunction of sacral region: Secondary | ICD-10-CM | POA: Diagnosis not present

## 2017-03-27 DIAGNOSIS — M5442 Lumbago with sciatica, left side: Secondary | ICD-10-CM | POA: Diagnosis not present

## 2017-03-29 DIAGNOSIS — M9904 Segmental and somatic dysfunction of sacral region: Secondary | ICD-10-CM | POA: Diagnosis not present

## 2017-03-29 DIAGNOSIS — M5136 Other intervertebral disc degeneration, lumbar region: Secondary | ICD-10-CM | POA: Diagnosis not present

## 2017-03-29 DIAGNOSIS — M5442 Lumbago with sciatica, left side: Secondary | ICD-10-CM | POA: Diagnosis not present

## 2017-03-29 DIAGNOSIS — M9903 Segmental and somatic dysfunction of lumbar region: Secondary | ICD-10-CM | POA: Diagnosis not present

## 2017-04-03 DIAGNOSIS — M9903 Segmental and somatic dysfunction of lumbar region: Secondary | ICD-10-CM | POA: Diagnosis not present

## 2017-04-03 DIAGNOSIS — M5442 Lumbago with sciatica, left side: Secondary | ICD-10-CM | POA: Diagnosis not present

## 2017-04-03 DIAGNOSIS — M9904 Segmental and somatic dysfunction of sacral region: Secondary | ICD-10-CM | POA: Diagnosis not present

## 2017-04-03 DIAGNOSIS — M5136 Other intervertebral disc degeneration, lumbar region: Secondary | ICD-10-CM | POA: Diagnosis not present

## 2017-04-04 DIAGNOSIS — M9903 Segmental and somatic dysfunction of lumbar region: Secondary | ICD-10-CM | POA: Diagnosis not present

## 2017-04-04 DIAGNOSIS — M9904 Segmental and somatic dysfunction of sacral region: Secondary | ICD-10-CM | POA: Diagnosis not present

## 2017-04-04 DIAGNOSIS — M5136 Other intervertebral disc degeneration, lumbar region: Secondary | ICD-10-CM | POA: Diagnosis not present

## 2017-04-04 DIAGNOSIS — M5442 Lumbago with sciatica, left side: Secondary | ICD-10-CM | POA: Diagnosis not present

## 2017-05-15 DIAGNOSIS — I10 Essential (primary) hypertension: Secondary | ICD-10-CM | POA: Diagnosis not present

## 2017-05-15 DIAGNOSIS — Z6839 Body mass index (BMI) 39.0-39.9, adult: Secondary | ICD-10-CM | POA: Diagnosis not present

## 2017-05-15 DIAGNOSIS — M5416 Radiculopathy, lumbar region: Secondary | ICD-10-CM | POA: Diagnosis not present

## 2017-05-15 DIAGNOSIS — M545 Low back pain: Secondary | ICD-10-CM | POA: Diagnosis not present

## 2017-07-04 DIAGNOSIS — I10 Essential (primary) hypertension: Secondary | ICD-10-CM | POA: Diagnosis not present

## 2017-07-04 DIAGNOSIS — K219 Gastro-esophageal reflux disease without esophagitis: Secondary | ICD-10-CM | POA: Diagnosis not present

## 2017-08-08 DIAGNOSIS — M5416 Radiculopathy, lumbar region: Secondary | ICD-10-CM | POA: Diagnosis not present

## 2017-08-08 DIAGNOSIS — I10 Essential (primary) hypertension: Secondary | ICD-10-CM | POA: Diagnosis not present

## 2017-08-08 DIAGNOSIS — Z6837 Body mass index (BMI) 37.0-37.9, adult: Secondary | ICD-10-CM | POA: Diagnosis not present

## 2017-08-08 DIAGNOSIS — M545 Low back pain: Secondary | ICD-10-CM | POA: Diagnosis not present

## 2017-08-15 DIAGNOSIS — I491 Atrial premature depolarization: Secondary | ICD-10-CM | POA: Diagnosis not present

## 2017-08-15 DIAGNOSIS — Z8249 Family history of ischemic heart disease and other diseases of the circulatory system: Secondary | ICD-10-CM | POA: Diagnosis not present

## 2017-08-15 DIAGNOSIS — I129 Hypertensive chronic kidney disease with stage 1 through stage 4 chronic kidney disease, or unspecified chronic kidney disease: Secondary | ICD-10-CM | POA: Diagnosis not present

## 2017-08-15 DIAGNOSIS — N189 Chronic kidney disease, unspecified: Secondary | ICD-10-CM | POA: Diagnosis not present

## 2017-10-14 DIAGNOSIS — J0100 Acute maxillary sinusitis, unspecified: Secondary | ICD-10-CM | POA: Diagnosis not present

## 2017-10-22 DIAGNOSIS — R079 Chest pain, unspecified: Secondary | ICD-10-CM | POA: Diagnosis not present

## 2017-10-22 DIAGNOSIS — N183 Chronic kidney disease, stage 3 (moderate): Secondary | ICD-10-CM | POA: Diagnosis not present

## 2017-10-22 DIAGNOSIS — I491 Atrial premature depolarization: Secondary | ICD-10-CM | POA: Diagnosis not present

## 2017-10-22 DIAGNOSIS — Z79899 Other long term (current) drug therapy: Secondary | ICD-10-CM | POA: Diagnosis not present

## 2017-10-22 DIAGNOSIS — R4182 Altered mental status, unspecified: Secondary | ICD-10-CM | POA: Diagnosis not present

## 2017-10-22 DIAGNOSIS — D72829 Elevated white blood cell count, unspecified: Secondary | ICD-10-CM | POA: Diagnosis not present

## 2017-10-22 DIAGNOSIS — M810 Age-related osteoporosis without current pathological fracture: Secondary | ICD-10-CM | POA: Diagnosis not present

## 2017-10-22 DIAGNOSIS — R0602 Shortness of breath: Secondary | ICD-10-CM | POA: Diagnosis not present

## 2017-10-22 DIAGNOSIS — R42 Dizziness and giddiness: Secondary | ICD-10-CM | POA: Diagnosis not present

## 2017-10-22 DIAGNOSIS — I129 Hypertensive chronic kidney disease with stage 1 through stage 4 chronic kidney disease, or unspecified chronic kidney disease: Secondary | ICD-10-CM | POA: Diagnosis not present

## 2017-10-22 DIAGNOSIS — Z87891 Personal history of nicotine dependence: Secondary | ICD-10-CM | POA: Diagnosis not present

## 2017-10-22 DIAGNOSIS — K219 Gastro-esophageal reflux disease without esophagitis: Secondary | ICD-10-CM | POA: Diagnosis not present

## 2017-10-22 DIAGNOSIS — M109 Gout, unspecified: Secondary | ICD-10-CM | POA: Diagnosis not present

## 2017-10-22 DIAGNOSIS — A0472 Enterocolitis due to Clostridium difficile, not specified as recurrent: Secondary | ICD-10-CM | POA: Diagnosis not present

## 2017-10-22 DIAGNOSIS — R197 Diarrhea, unspecified: Secondary | ICD-10-CM | POA: Diagnosis not present

## 2017-10-22 DIAGNOSIS — G9340 Encephalopathy, unspecified: Secondary | ICD-10-CM | POA: Diagnosis not present

## 2017-10-22 DIAGNOSIS — R410 Disorientation, unspecified: Secondary | ICD-10-CM | POA: Diagnosis not present

## 2017-10-23 DIAGNOSIS — M109 Gout, unspecified: Secondary | ICD-10-CM | POA: Diagnosis not present

## 2017-10-23 DIAGNOSIS — I639 Cerebral infarction, unspecified: Secondary | ICD-10-CM | POA: Diagnosis not present

## 2017-10-23 DIAGNOSIS — D72829 Elevated white blood cell count, unspecified: Secondary | ICD-10-CM | POA: Diagnosis not present

## 2017-10-23 DIAGNOSIS — I491 Atrial premature depolarization: Secondary | ICD-10-CM | POA: Diagnosis not present

## 2017-10-23 DIAGNOSIS — R410 Disorientation, unspecified: Secondary | ICD-10-CM | POA: Diagnosis not present

## 2017-10-23 DIAGNOSIS — G9340 Encephalopathy, unspecified: Secondary | ICD-10-CM | POA: Diagnosis not present

## 2017-10-23 DIAGNOSIS — N183 Chronic kidney disease, stage 3 (moderate): Secondary | ICD-10-CM | POA: Diagnosis not present

## 2017-10-23 DIAGNOSIS — I129 Hypertensive chronic kidney disease with stage 1 through stage 4 chronic kidney disease, or unspecified chronic kidney disease: Secondary | ICD-10-CM | POA: Diagnosis not present

## 2017-10-23 DIAGNOSIS — R42 Dizziness and giddiness: Secondary | ICD-10-CM | POA: Diagnosis not present

## 2017-10-23 DIAGNOSIS — R4701 Aphasia: Secondary | ICD-10-CM | POA: Diagnosis not present

## 2017-10-23 DIAGNOSIS — Z79899 Other long term (current) drug therapy: Secondary | ICD-10-CM | POA: Diagnosis not present

## 2017-10-23 DIAGNOSIS — Z87891 Personal history of nicotine dependence: Secondary | ICD-10-CM | POA: Diagnosis not present

## 2017-10-23 DIAGNOSIS — K219 Gastro-esophageal reflux disease without esophagitis: Secondary | ICD-10-CM | POA: Diagnosis not present

## 2017-10-23 DIAGNOSIS — R0602 Shortness of breath: Secondary | ICD-10-CM | POA: Diagnosis not present

## 2017-10-23 DIAGNOSIS — M810 Age-related osteoporosis without current pathological fracture: Secondary | ICD-10-CM | POA: Diagnosis not present

## 2017-10-23 DIAGNOSIS — R197 Diarrhea, unspecified: Secondary | ICD-10-CM | POA: Diagnosis not present

## 2017-10-30 DIAGNOSIS — N183 Chronic kidney disease, stage 3 (moderate): Secondary | ICD-10-CM | POA: Diagnosis not present

## 2017-10-30 DIAGNOSIS — D703 Neutropenia due to infection: Secondary | ICD-10-CM | POA: Diagnosis not present

## 2017-10-30 DIAGNOSIS — K521 Toxic gastroenteritis and colitis: Secondary | ICD-10-CM | POA: Diagnosis not present

## 2017-11-01 DIAGNOSIS — M545 Low back pain: Secondary | ICD-10-CM | POA: Diagnosis not present

## 2017-11-01 DIAGNOSIS — M5416 Radiculopathy, lumbar region: Secondary | ICD-10-CM | POA: Diagnosis not present

## 2017-11-01 DIAGNOSIS — Z6837 Body mass index (BMI) 37.0-37.9, adult: Secondary | ICD-10-CM | POA: Diagnosis not present

## 2017-11-01 DIAGNOSIS — I10 Essential (primary) hypertension: Secondary | ICD-10-CM | POA: Diagnosis not present

## 2017-12-04 DIAGNOSIS — I10 Essential (primary) hypertension: Secondary | ICD-10-CM | POA: Diagnosis not present

## 2017-12-04 DIAGNOSIS — K219 Gastro-esophageal reflux disease without esophagitis: Secondary | ICD-10-CM | POA: Diagnosis not present

## 2017-12-04 DIAGNOSIS — J020 Streptococcal pharyngitis: Secondary | ICD-10-CM | POA: Diagnosis not present

## 2017-12-04 DIAGNOSIS — N183 Chronic kidney disease, stage 3 (moderate): Secondary | ICD-10-CM | POA: Diagnosis not present

## 2017-12-12 DIAGNOSIS — Z1231 Encounter for screening mammogram for malignant neoplasm of breast: Secondary | ICD-10-CM | POA: Diagnosis not present

## 2017-12-12 DIAGNOSIS — Z01419 Encounter for gynecological examination (general) (routine) without abnormal findings: Secondary | ICD-10-CM | POA: Diagnosis not present

## 2017-12-12 DIAGNOSIS — Z780 Asymptomatic menopausal state: Secondary | ICD-10-CM | POA: Diagnosis not present

## 2017-12-19 DIAGNOSIS — M8580 Other specified disorders of bone density and structure, unspecified site: Secondary | ICD-10-CM | POA: Diagnosis not present

## 2017-12-26 DIAGNOSIS — H66002 Acute suppurative otitis media without spontaneous rupture of ear drum, left ear: Secondary | ICD-10-CM | POA: Diagnosis not present

## 2017-12-26 DIAGNOSIS — R6889 Other general symptoms and signs: Secondary | ICD-10-CM | POA: Diagnosis not present

## 2017-12-26 DIAGNOSIS — J069 Acute upper respiratory infection, unspecified: Secondary | ICD-10-CM | POA: Diagnosis not present

## 2017-12-26 DIAGNOSIS — I10 Essential (primary) hypertension: Secondary | ICD-10-CM | POA: Diagnosis not present

## 2017-12-26 DIAGNOSIS — N183 Chronic kidney disease, stage 3 (moderate): Secondary | ICD-10-CM | POA: Diagnosis not present

## 2017-12-26 DIAGNOSIS — J029 Acute pharyngitis, unspecified: Secondary | ICD-10-CM | POA: Diagnosis not present

## 2018-01-20 DIAGNOSIS — S8012XA Contusion of left lower leg, initial encounter: Secondary | ICD-10-CM | POA: Diagnosis not present

## 2018-01-20 DIAGNOSIS — M7989 Other specified soft tissue disorders: Secondary | ICD-10-CM | POA: Diagnosis not present

## 2018-01-20 DIAGNOSIS — M79662 Pain in left lower leg: Secondary | ICD-10-CM | POA: Diagnosis not present

## 2018-01-23 DIAGNOSIS — M545 Low back pain: Secondary | ICD-10-CM | POA: Diagnosis not present

## 2018-01-23 DIAGNOSIS — I10 Essential (primary) hypertension: Secondary | ICD-10-CM | POA: Diagnosis not present

## 2018-01-23 DIAGNOSIS — M5416 Radiculopathy, lumbar region: Secondary | ICD-10-CM | POA: Diagnosis not present

## 2018-01-23 DIAGNOSIS — Z6836 Body mass index (BMI) 36.0-36.9, adult: Secondary | ICD-10-CM | POA: Diagnosis not present

## 2018-02-28 DIAGNOSIS — I129 Hypertensive chronic kidney disease with stage 1 through stage 4 chronic kidney disease, or unspecified chronic kidney disease: Secondary | ICD-10-CM | POA: Diagnosis not present

## 2018-02-28 DIAGNOSIS — N189 Chronic kidney disease, unspecified: Secondary | ICD-10-CM | POA: Diagnosis not present

## 2018-02-28 DIAGNOSIS — I491 Atrial premature depolarization: Secondary | ICD-10-CM | POA: Diagnosis not present

## 2018-03-06 DIAGNOSIS — N183 Chronic kidney disease, stage 3 (moderate): Secondary | ICD-10-CM | POA: Diagnosis not present

## 2018-03-06 DIAGNOSIS — E559 Vitamin D deficiency, unspecified: Secondary | ICD-10-CM | POA: Diagnosis not present

## 2018-03-06 DIAGNOSIS — D631 Anemia in chronic kidney disease: Secondary | ICD-10-CM | POA: Diagnosis not present

## 2018-03-06 DIAGNOSIS — I129 Hypertensive chronic kidney disease with stage 1 through stage 4 chronic kidney disease, or unspecified chronic kidney disease: Secondary | ICD-10-CM | POA: Diagnosis not present

## 2018-03-22 IMAGING — CR DG LUMBAR SPINE 2-3V
2 series · 2 of 2 positions shown · non-contrast
Comparison: Lumbar MRI 02/13/2016.  Radiographs 01/26/2016.

CLINICAL DATA: Opening films for L3-4 laminectomy.

EXAM:
LUMBAR SPINE - 2-3 VIEW

[lat (1 of 2)]
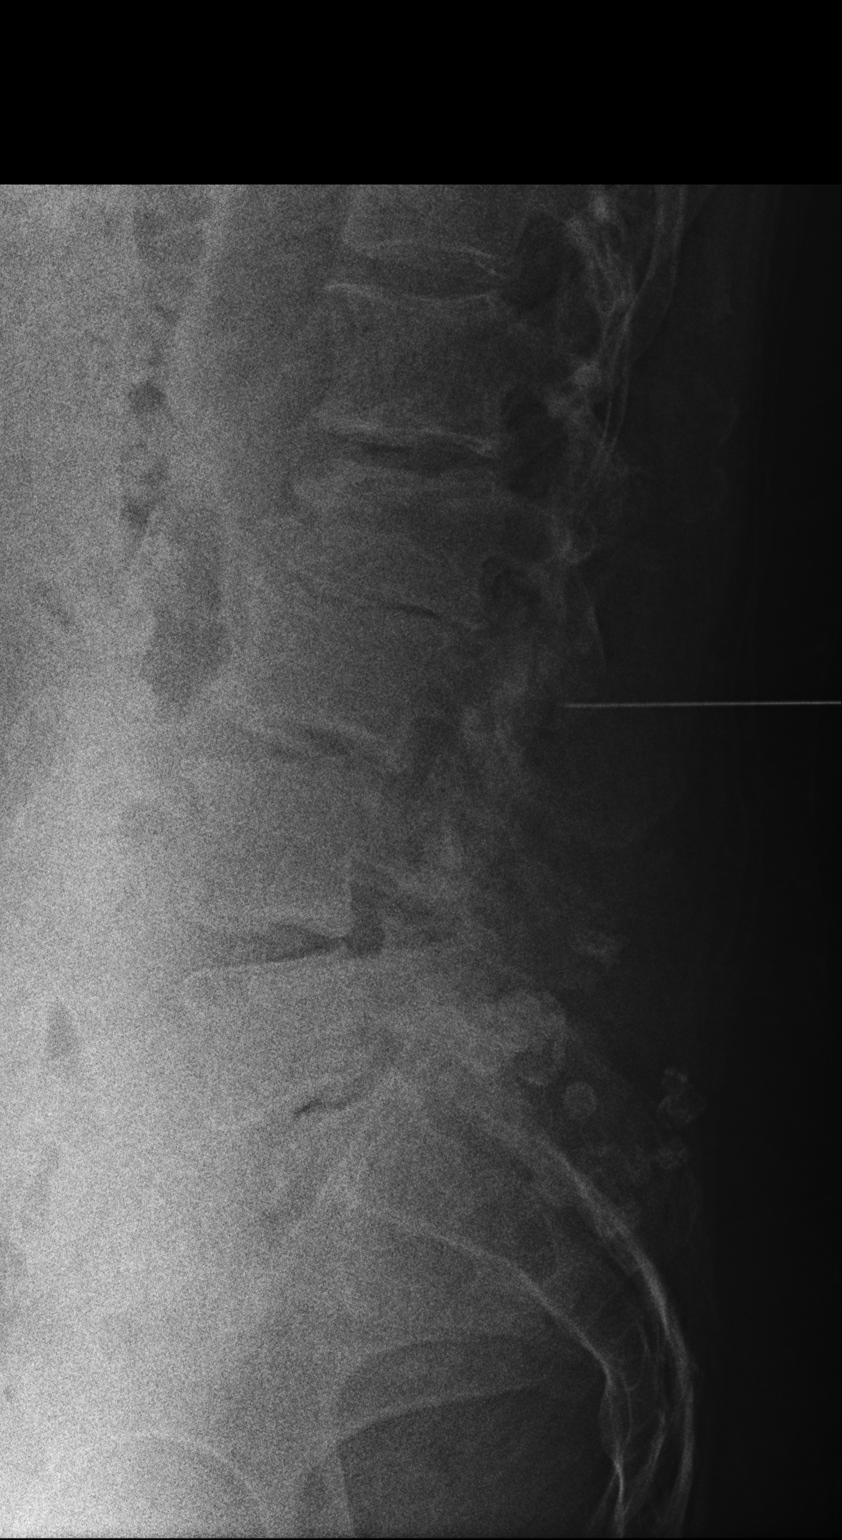

[lat (2 of 2)]
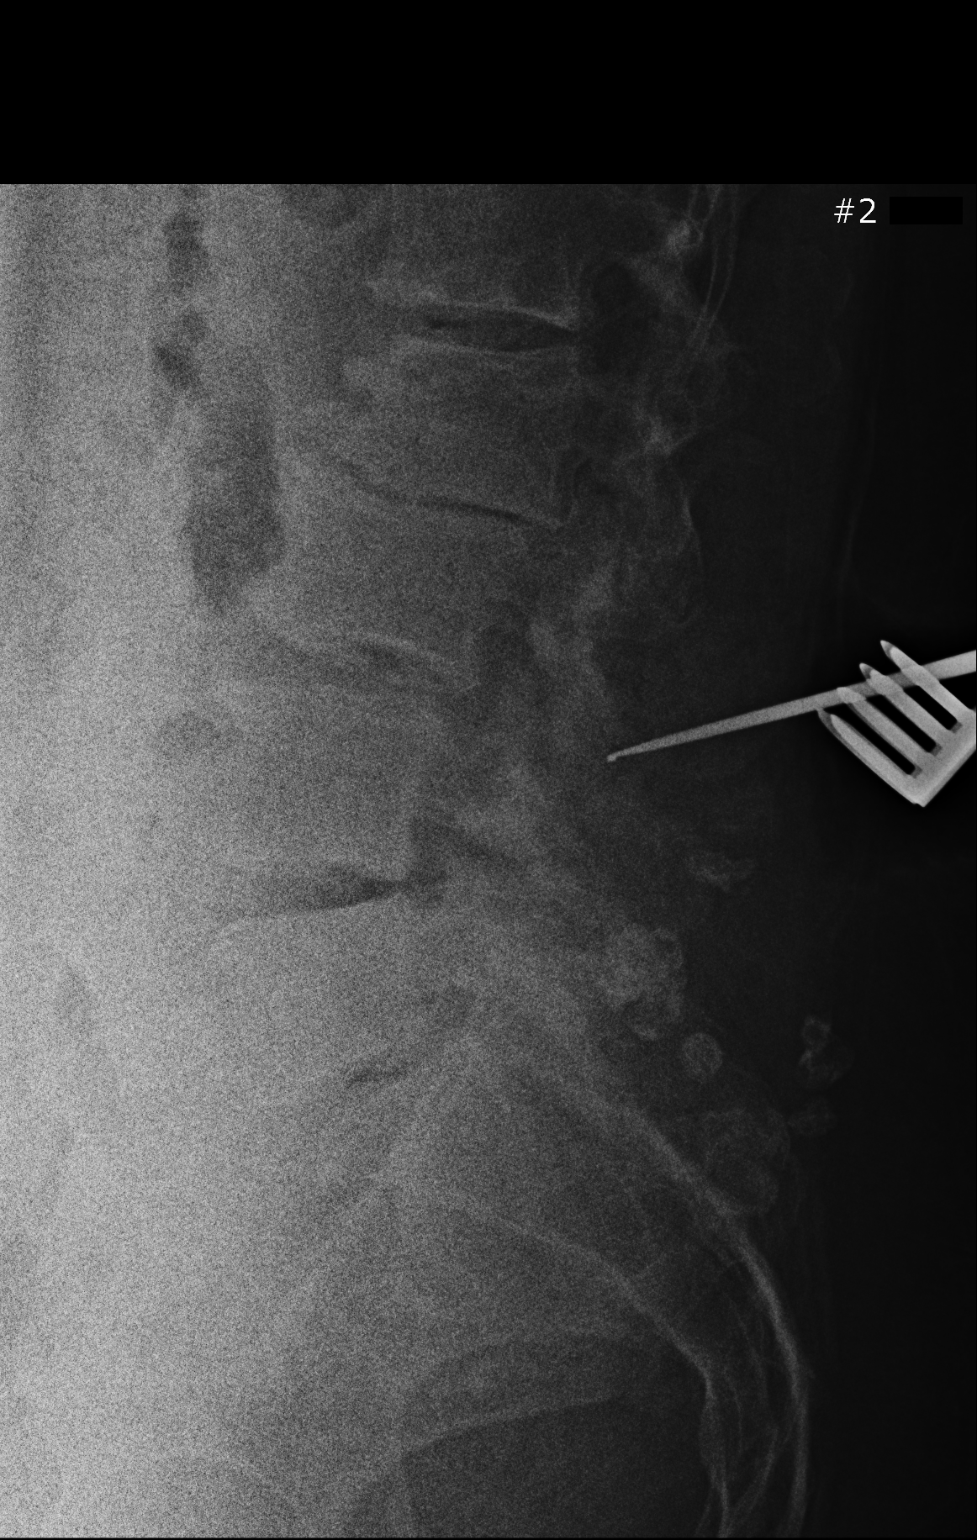

[2 of 2 positions shown; findings below may reference images not displayed]

FINDINGS: Two cross-table lateral views of the lumbar spine are submitted from
the operating room. On the first image obtained at 4244 hours, there
is a posterior localizing needle between the L2 and L3 spinous
processes. On the second image obtained at 6018 hours, there is a
blunt surgical instrument directed more inferiorly towards the
posterior aspect of the L3-4 disc space. Skin spreaders are in
place. The spinal alignment and multilevel spondylosis appear
grossly unchanged.
IMPRESSION: Intraoperative localization as described.

## 2018-05-22 DIAGNOSIS — M545 Low back pain: Secondary | ICD-10-CM | POA: Diagnosis not present

## 2018-05-22 DIAGNOSIS — M5416 Radiculopathy, lumbar region: Secondary | ICD-10-CM | POA: Diagnosis not present

## 2018-05-22 DIAGNOSIS — I10 Essential (primary) hypertension: Secondary | ICD-10-CM | POA: Diagnosis not present

## 2018-05-22 DIAGNOSIS — Z6836 Body mass index (BMI) 36.0-36.9, adult: Secondary | ICD-10-CM | POA: Diagnosis not present

## 2018-06-28 DIAGNOSIS — R7301 Impaired fasting glucose: Secondary | ICD-10-CM | POA: Diagnosis not present

## 2018-06-28 DIAGNOSIS — N183 Chronic kidney disease, stage 3 (moderate): Secondary | ICD-10-CM | POA: Diagnosis not present

## 2018-06-28 DIAGNOSIS — I10 Essential (primary) hypertension: Secondary | ICD-10-CM | POA: Diagnosis not present

## 2018-06-28 DIAGNOSIS — J029 Acute pharyngitis, unspecified: Secondary | ICD-10-CM | POA: Diagnosis not present

## 2018-06-28 DIAGNOSIS — E7800 Pure hypercholesterolemia, unspecified: Secondary | ICD-10-CM | POA: Diagnosis not present

## 2018-07-23 DIAGNOSIS — M7989 Other specified soft tissue disorders: Secondary | ICD-10-CM | POA: Diagnosis not present

## 2018-07-23 DIAGNOSIS — I10 Essential (primary) hypertension: Secondary | ICD-10-CM | POA: Diagnosis not present

## 2018-07-27 DIAGNOSIS — I491 Atrial premature depolarization: Secondary | ICD-10-CM | POA: Diagnosis not present

## 2018-07-27 DIAGNOSIS — E876 Hypokalemia: Secondary | ICD-10-CM | POA: Diagnosis not present

## 2018-07-27 DIAGNOSIS — E668 Other obesity: Secondary | ICD-10-CM | POA: Diagnosis not present

## 2018-07-27 DIAGNOSIS — B9620 Unspecified Escherichia coli [E. coli] as the cause of diseases classified elsewhere: Secondary | ICD-10-CM | POA: Diagnosis not present

## 2018-07-27 DIAGNOSIS — K219 Gastro-esophageal reflux disease without esophagitis: Secondary | ICD-10-CM | POA: Diagnosis not present

## 2018-07-27 DIAGNOSIS — M549 Dorsalgia, unspecified: Secondary | ICD-10-CM | POA: Diagnosis not present

## 2018-07-27 DIAGNOSIS — Z6837 Body mass index (BMI) 37.0-37.9, adult: Secondary | ICD-10-CM | POA: Diagnosis not present

## 2018-07-27 DIAGNOSIS — R079 Chest pain, unspecified: Secondary | ICD-10-CM | POA: Diagnosis not present

## 2018-07-27 DIAGNOSIS — I10 Essential (primary) hypertension: Secondary | ICD-10-CM | POA: Diagnosis not present

## 2018-07-27 DIAGNOSIS — N183 Chronic kidney disease, stage 3 (moderate): Secondary | ICD-10-CM | POA: Diagnosis not present

## 2018-07-27 DIAGNOSIS — R0600 Dyspnea, unspecified: Secondary | ICD-10-CM | POA: Diagnosis not present

## 2018-07-27 DIAGNOSIS — N3000 Acute cystitis without hematuria: Secondary | ICD-10-CM | POA: Diagnosis not present

## 2018-07-27 DIAGNOSIS — R0789 Other chest pain: Secondary | ICD-10-CM | POA: Diagnosis not present

## 2018-07-27 DIAGNOSIS — G8929 Other chronic pain: Secondary | ICD-10-CM | POA: Diagnosis not present

## 2018-07-27 DIAGNOSIS — R350 Frequency of micturition: Secondary | ICD-10-CM | POA: Diagnosis not present

## 2018-07-27 DIAGNOSIS — I4891 Unspecified atrial fibrillation: Secondary | ICD-10-CM | POA: Diagnosis not present

## 2018-07-27 DIAGNOSIS — R0602 Shortness of breath: Secondary | ICD-10-CM | POA: Diagnosis not present

## 2018-07-27 DIAGNOSIS — R600 Localized edema: Secondary | ICD-10-CM | POA: Diagnosis not present

## 2018-07-27 DIAGNOSIS — I480 Paroxysmal atrial fibrillation: Secondary | ICD-10-CM | POA: Diagnosis not present

## 2018-07-27 DIAGNOSIS — M1A9XX0 Chronic gout, unspecified, without tophus (tophi): Secondary | ICD-10-CM | POA: Diagnosis not present

## 2018-07-27 DIAGNOSIS — I129 Hypertensive chronic kidney disease with stage 1 through stage 4 chronic kidney disease, or unspecified chronic kidney disease: Secondary | ICD-10-CM | POA: Diagnosis not present

## 2018-07-28 DIAGNOSIS — R0600 Dyspnea, unspecified: Secondary | ICD-10-CM | POA: Diagnosis not present

## 2018-07-28 DIAGNOSIS — B9620 Unspecified Escherichia coli [E. coli] as the cause of diseases classified elsewhere: Secondary | ICD-10-CM | POA: Diagnosis not present

## 2018-07-28 DIAGNOSIS — N3000 Acute cystitis without hematuria: Secondary | ICD-10-CM | POA: Diagnosis not present

## 2018-07-28 DIAGNOSIS — E668 Other obesity: Secondary | ICD-10-CM | POA: Diagnosis not present

## 2018-07-28 DIAGNOSIS — K219 Gastro-esophageal reflux disease without esophagitis: Secondary | ICD-10-CM | POA: Diagnosis not present

## 2018-07-28 DIAGNOSIS — M549 Dorsalgia, unspecified: Secondary | ICD-10-CM | POA: Diagnosis not present

## 2018-07-28 DIAGNOSIS — G8929 Other chronic pain: Secondary | ICD-10-CM | POA: Diagnosis not present

## 2018-07-28 DIAGNOSIS — R600 Localized edema: Secondary | ICD-10-CM | POA: Diagnosis not present

## 2018-07-28 DIAGNOSIS — I129 Hypertensive chronic kidney disease with stage 1 through stage 4 chronic kidney disease, or unspecified chronic kidney disease: Secondary | ICD-10-CM | POA: Diagnosis not present

## 2018-07-28 DIAGNOSIS — Z6837 Body mass index (BMI) 37.0-37.9, adult: Secondary | ICD-10-CM | POA: Diagnosis not present

## 2018-07-28 DIAGNOSIS — I10 Essential (primary) hypertension: Secondary | ICD-10-CM | POA: Diagnosis not present

## 2018-07-28 DIAGNOSIS — I491 Atrial premature depolarization: Secondary | ICD-10-CM | POA: Diagnosis not present

## 2018-07-28 DIAGNOSIS — R079 Chest pain, unspecified: Secondary | ICD-10-CM | POA: Diagnosis not present

## 2018-07-28 DIAGNOSIS — N183 Chronic kidney disease, stage 3 (moderate): Secondary | ICD-10-CM | POA: Diagnosis not present

## 2018-07-28 DIAGNOSIS — I4891 Unspecified atrial fibrillation: Secondary | ICD-10-CM | POA: Diagnosis not present

## 2018-07-28 DIAGNOSIS — I480 Paroxysmal atrial fibrillation: Secondary | ICD-10-CM | POA: Diagnosis not present

## 2018-07-28 DIAGNOSIS — E876 Hypokalemia: Secondary | ICD-10-CM | POA: Diagnosis not present

## 2018-07-28 DIAGNOSIS — M1A9XX0 Chronic gout, unspecified, without tophus (tophi): Secondary | ICD-10-CM | POA: Diagnosis not present

## 2018-07-29 DIAGNOSIS — R079 Chest pain, unspecified: Secondary | ICD-10-CM | POA: Diagnosis not present

## 2018-07-29 DIAGNOSIS — R9431 Abnormal electrocardiogram [ECG] [EKG]: Secondary | ICD-10-CM | POA: Diagnosis not present

## 2018-07-31 DIAGNOSIS — M5416 Radiculopathy, lumbar region: Secondary | ICD-10-CM | POA: Diagnosis not present

## 2018-07-31 DIAGNOSIS — Z6837 Body mass index (BMI) 37.0-37.9, adult: Secondary | ICD-10-CM | POA: Diagnosis not present

## 2018-07-31 DIAGNOSIS — M545 Low back pain: Secondary | ICD-10-CM | POA: Diagnosis not present

## 2018-07-31 DIAGNOSIS — I10 Essential (primary) hypertension: Secondary | ICD-10-CM | POA: Diagnosis not present

## 2018-08-01 DIAGNOSIS — I10 Essential (primary) hypertension: Secondary | ICD-10-CM | POA: Diagnosis not present

## 2018-08-01 DIAGNOSIS — I480 Paroxysmal atrial fibrillation: Secondary | ICD-10-CM | POA: Diagnosis not present

## 2018-08-01 DIAGNOSIS — I4892 Unspecified atrial flutter: Secondary | ICD-10-CM | POA: Diagnosis not present

## 2018-08-07 DIAGNOSIS — Z23 Encounter for immunization: Secondary | ICD-10-CM | POA: Diagnosis not present

## 2018-08-07 DIAGNOSIS — N183 Chronic kidney disease, stage 3 (moderate): Secondary | ICD-10-CM | POA: Diagnosis not present

## 2018-08-07 DIAGNOSIS — I491 Atrial premature depolarization: Secondary | ICD-10-CM | POA: Diagnosis not present

## 2018-08-07 DIAGNOSIS — N3000 Acute cystitis without hematuria: Secondary | ICD-10-CM | POA: Diagnosis not present

## 2018-08-07 DIAGNOSIS — I10 Essential (primary) hypertension: Secondary | ICD-10-CM | POA: Diagnosis not present

## 2018-09-14 DIAGNOSIS — R000 Tachycardia, unspecified: Secondary | ICD-10-CM | POA: Diagnosis not present

## 2018-09-14 DIAGNOSIS — J0100 Acute maxillary sinusitis, unspecified: Secondary | ICD-10-CM | POA: Diagnosis not present

## 2018-09-14 DIAGNOSIS — Z8679 Personal history of other diseases of the circulatory system: Secondary | ICD-10-CM | POA: Diagnosis not present

## 2018-09-18 DIAGNOSIS — I10 Essential (primary) hypertension: Secondary | ICD-10-CM | POA: Diagnosis not present

## 2018-09-18 DIAGNOSIS — I480 Paroxysmal atrial fibrillation: Secondary | ICD-10-CM | POA: Diagnosis not present

## 2018-09-25 DIAGNOSIS — I480 Paroxysmal atrial fibrillation: Secondary | ICD-10-CM | POA: Diagnosis not present

## 2018-09-25 DIAGNOSIS — I4891 Unspecified atrial fibrillation: Secondary | ICD-10-CM | POA: Diagnosis not present

## 2018-09-29 DIAGNOSIS — J0110 Acute frontal sinusitis, unspecified: Secondary | ICD-10-CM | POA: Diagnosis not present

## 2018-09-29 DIAGNOSIS — I10 Essential (primary) hypertension: Secondary | ICD-10-CM | POA: Diagnosis not present

## 2018-09-29 DIAGNOSIS — I4891 Unspecified atrial fibrillation: Secondary | ICD-10-CM | POA: Diagnosis not present

## 2018-10-24 DIAGNOSIS — Z6837 Body mass index (BMI) 37.0-37.9, adult: Secondary | ICD-10-CM | POA: Diagnosis not present

## 2018-10-24 DIAGNOSIS — M5416 Radiculopathy, lumbar region: Secondary | ICD-10-CM | POA: Diagnosis not present

## 2018-10-24 DIAGNOSIS — M545 Low back pain: Secondary | ICD-10-CM | POA: Diagnosis not present

## 2018-10-24 DIAGNOSIS — I10 Essential (primary) hypertension: Secondary | ICD-10-CM | POA: Diagnosis not present

## 2018-12-18 DIAGNOSIS — Z01419 Encounter for gynecological examination (general) (routine) without abnormal findings: Secondary | ICD-10-CM | POA: Diagnosis not present

## 2018-12-18 DIAGNOSIS — Z1231 Encounter for screening mammogram for malignant neoplasm of breast: Secondary | ICD-10-CM | POA: Diagnosis not present

## 2019-01-02 DIAGNOSIS — R6889 Other general symptoms and signs: Secondary | ICD-10-CM | POA: Diagnosis not present

## 2019-01-02 DIAGNOSIS — N183 Chronic kidney disease, stage 3 (moderate): Secondary | ICD-10-CM | POA: Diagnosis not present

## 2019-01-02 DIAGNOSIS — I10 Essential (primary) hypertension: Secondary | ICD-10-CM | POA: Diagnosis not present

## 2019-01-02 DIAGNOSIS — Z780 Asymptomatic menopausal state: Secondary | ICD-10-CM | POA: Diagnosis not present

## 2019-01-09 DIAGNOSIS — I10 Essential (primary) hypertension: Secondary | ICD-10-CM | POA: Diagnosis not present

## 2019-01-09 DIAGNOSIS — Z6836 Body mass index (BMI) 36.0-36.9, adult: Secondary | ICD-10-CM | POA: Diagnosis not present

## 2019-03-12 DIAGNOSIS — I129 Hypertensive chronic kidney disease with stage 1 through stage 4 chronic kidney disease, or unspecified chronic kidney disease: Secondary | ICD-10-CM | POA: Diagnosis not present

## 2019-03-12 DIAGNOSIS — D631 Anemia in chronic kidney disease: Secondary | ICD-10-CM | POA: Diagnosis not present

## 2019-03-12 DIAGNOSIS — N183 Chronic kidney disease, stage 3 (moderate): Secondary | ICD-10-CM | POA: Diagnosis not present

## 2019-03-27 DIAGNOSIS — M48061 Spinal stenosis, lumbar region without neurogenic claudication: Secondary | ICD-10-CM | POA: Diagnosis not present

## 2019-03-27 DIAGNOSIS — M5416 Radiculopathy, lumbar region: Secondary | ICD-10-CM | POA: Diagnosis not present

## 2019-06-25 DIAGNOSIS — M48061 Spinal stenosis, lumbar region without neurogenic claudication: Secondary | ICD-10-CM | POA: Diagnosis not present

## 2019-06-25 DIAGNOSIS — M4317 Spondylolisthesis, lumbosacral region: Secondary | ICD-10-CM | POA: Diagnosis not present

## 2019-06-25 DIAGNOSIS — Z6837 Body mass index (BMI) 37.0-37.9, adult: Secondary | ICD-10-CM | POA: Diagnosis not present

## 2019-06-25 DIAGNOSIS — I10 Essential (primary) hypertension: Secondary | ICD-10-CM | POA: Diagnosis not present

## 2019-07-02 DIAGNOSIS — I10 Essential (primary) hypertension: Secondary | ICD-10-CM | POA: Diagnosis not present

## 2019-07-02 DIAGNOSIS — I480 Paroxysmal atrial fibrillation: Secondary | ICD-10-CM | POA: Diagnosis not present

## 2019-07-02 DIAGNOSIS — I4892 Unspecified atrial flutter: Secondary | ICD-10-CM | POA: Diagnosis not present

## 2019-09-24 DIAGNOSIS — Z6837 Body mass index (BMI) 37.0-37.9, adult: Secondary | ICD-10-CM | POA: Diagnosis not present

## 2019-09-24 DIAGNOSIS — Z79899 Other long term (current) drug therapy: Secondary | ICD-10-CM | POA: Diagnosis not present

## 2019-09-24 DIAGNOSIS — M533 Sacrococcygeal disorders, not elsewhere classified: Secondary | ICD-10-CM | POA: Diagnosis not present

## 2019-09-24 DIAGNOSIS — M4317 Spondylolisthesis, lumbosacral region: Secondary | ICD-10-CM | POA: Diagnosis not present

## 2019-09-24 DIAGNOSIS — I10 Essential (primary) hypertension: Secondary | ICD-10-CM | POA: Diagnosis not present

## 2024-09-19 ENCOUNTER — Other Ambulatory Visit: Payer: Self-pay | Admitting: Neurosurgery

## 2024-09-19 DIAGNOSIS — M5416 Radiculopathy, lumbar region: Secondary | ICD-10-CM

## 2024-10-09 ENCOUNTER — Ambulatory Visit
Admission: RE | Admit: 2024-10-09 | Discharge: 2024-10-09 | Disposition: A | Discharge status: Home / Self Care | Source: Ambulatory Visit | Attending: Neurosurgery | Admitting: Neurosurgery

## 2024-10-09 DIAGNOSIS — M5416 Radiculopathy, lumbar region: Secondary | ICD-10-CM
